# Patient Record
Sex: Male | Born: 1970 | Race: White | Hispanic: No | Smoking: Current every day smoker
Health system: Southern US, Community
[De-identification: ages and names within clinical notes are randomized; demographics above are authoritative.]

## PROBLEM LIST (undated history)

## (undated) DIAGNOSIS — IMO0002 Reserved for concepts with insufficient information to code with codable children: Secondary | ICD-10-CM

## (undated) DIAGNOSIS — E785 Hyperlipidemia, unspecified: Secondary | ICD-10-CM

## (undated) DIAGNOSIS — M549 Dorsalgia, unspecified: Secondary | ICD-10-CM

## (undated) DIAGNOSIS — F419 Anxiety disorder, unspecified: Secondary | ICD-10-CM

## (undated) DIAGNOSIS — G8929 Other chronic pain: Secondary | ICD-10-CM

## (undated) HISTORY — DX: Other chronic pain: G89.29

## (undated) HISTORY — DX: Hyperlipidemia, unspecified: E78.5

## (undated) HISTORY — DX: Reserved for concepts with insufficient information to code with codable children: IMO0002

## (undated) HISTORY — PX: NO PAST SURGERIES: SHX2092

## (undated) HISTORY — DX: Dorsalgia, unspecified: M54.9

---

## 2013-08-19 ENCOUNTER — Ambulatory Visit (INDEPENDENT_AMBULATORY_CARE_PROVIDER_SITE_OTHER): Payer: PRIVATE HEALTH INSURANCE | Admitting: Emergency Medicine

## 2013-08-19 VITALS — BP 114/60 | HR 78 | Temp 98.1°F | Resp 16 | Ht 67.25 in | Wt 183.2 lb

## 2013-08-19 DIAGNOSIS — G894 Chronic pain syndrome: Secondary | ICD-10-CM

## 2013-08-19 MED ORDER — HYDROCODONE-ACETAMINOPHEN 5-325 MG PO TABS: 1.0000 | ORAL_TABLET | Freq: Four times a day (QID) | ORAL | Status: DC | PRN

## 2013-08-19 NOTE — Patient Instructions (Signed)

## 2013-08-19 NOTE — Progress Notes (Signed)
[facility] [address] [place] [address] [phone]  Date:  08/19/2013   Name:  Leonard Martinez   DOB:  [birth year]   MRN:  [id number]  PCP:  No primary provider on file.    Chief Complaint: Medication Refill   History of Present Illness:  Leonard Martinez is a 42 y.o. very pleasant male patient who presents with the following:  Says he has been under treatment for chronic low back and neck pain with a physician in [place], [place] for 5 years.  Is out of his medication and unable to refill his vicodin due to a rescheduling of the medication.  Needs a refill on his narcotic.  Pain is not radiating and not associated with numbness or paresthesias.  No improvement with over the counter medications or other home remedies. Denies other complaint or health concern today.   There are no active problems to display for this patient.   Past Medical History  Diagnosis Date  . Hyperlipidemia   . Herniated disc   . Chronic back pain     History reviewed. No pertinent past surgical history.  History  Substance Use Topics  . Smoking status: Current Every Day Smoker -- 10.00 packs/day    Types: Cigarettes  . Smokeless tobacco: Not on file  . Alcohol Use: No    Family History  Problem Relation Age of Onset  . Bladder Cancer Mother   . Pancreatic cancer Father     No Known Allergies  Medication list has been reviewed and updated.  No current outpatient prescriptions on file prior to visit.   No current facility-administered medications on file prior to visit.    Review of Systems:  As per HPI, otherwise negative.    Physical Examination: Filed Vitals:   08/19/13 1735  BP: 114/60  Pulse: 78  Temp: 98.1 F (36.7 C)  Resp: 16   Filed Vitals:   08/19/13 1735  Height: 5' 7.25" (1.708 m)  Weight: 183 lb 3.2 oz (83.099 kg)   Body mass index is 28.49 kg/(m^2). Ideal Body Weight: Weight in (lb) to have BMI = 25: 160.5   GEN: WDWN, NAD,  Non-toxic, Alert & Oriented x 3 HEENT: Atraumatic, Normocephalic.  Ears and Nose: No external deformity. EXTR: No clubbing/cyanosis/edema NEURO: Normal gait.  PSYCH: Normally interactive. Conversant. Not depressed or anxious appearing.  Calm demeanor.    Assessment and Plan: I explained that he could not be treated for chronic pain in this office as it violates policy Further, I explained that he would be required to provide office records from his previous doctor for a referral to take place I offered a referral to pain management and one week only of medication with the understanding that no refill will be done.  Signed,  Phillips Odor, MD

## 2014-03-23 DIAGNOSIS — Y9289 Other specified places as the place of occurrence of the external cause: Secondary | ICD-10-CM | POA: Insufficient documentation

## 2014-03-23 DIAGNOSIS — Z79899 Other long term (current) drug therapy: Secondary | ICD-10-CM | POA: Insufficient documentation

## 2014-03-23 DIAGNOSIS — Z8739 Personal history of other diseases of the musculoskeletal system and connective tissue: Secondary | ICD-10-CM | POA: Insufficient documentation

## 2014-03-23 DIAGNOSIS — Y9389 Activity, other specified: Secondary | ICD-10-CM | POA: Insufficient documentation

## 2014-03-23 DIAGNOSIS — T25219A Burn of second degree of unspecified ankle, initial encounter: Secondary | ICD-10-CM | POA: Insufficient documentation

## 2014-03-23 DIAGNOSIS — X131XXA Other contact with steam and other hot vapors, initial encounter: Secondary | ICD-10-CM

## 2014-03-23 DIAGNOSIS — X12XXXA Contact with other hot fluids, initial encounter: Secondary | ICD-10-CM | POA: Insufficient documentation

## 2014-03-23 DIAGNOSIS — Y990 Civilian activity done for income or pay: Secondary | ICD-10-CM | POA: Insufficient documentation

## 2014-03-23 DIAGNOSIS — F17200 Nicotine dependence, unspecified, uncomplicated: Secondary | ICD-10-CM | POA: Insufficient documentation

## 2014-03-23 DIAGNOSIS — Z23 Encounter for immunization: Secondary | ICD-10-CM | POA: Insufficient documentation

## 2014-03-23 DIAGNOSIS — E785 Hyperlipidemia, unspecified: Secondary | ICD-10-CM | POA: Insufficient documentation

## 2014-03-23 DIAGNOSIS — G8929 Other chronic pain: Secondary | ICD-10-CM | POA: Insufficient documentation

## 2014-03-23 NOTE — ED Notes (Signed)
Pt reports spilling pot of hot water on foot

## 2014-03-24 ENCOUNTER — Encounter (HOSPITAL_BASED_OUTPATIENT_CLINIC_OR_DEPARTMENT_OTHER): Payer: Self-pay | Admitting: Emergency Medicine

## 2014-03-24 ENCOUNTER — Emergency Department (HOSPITAL_BASED_OUTPATIENT_CLINIC_OR_DEPARTMENT_OTHER)
Admission: EM | Admit: 2014-03-24 | Discharge: 2014-03-24 | Disposition: A | Discharge status: Home / Self Care | Payer: Worker's Compensation | Attending: Emergency Medicine | Admitting: Emergency Medicine

## 2014-03-24 DIAGNOSIS — T25022A Burn of unspecified degree of left foot, initial encounter: Secondary | ICD-10-CM

## 2014-03-24 MED ORDER — IBUPROFEN 600 MG PO TABS: 600.0000 mg | ORAL_TABLET | Freq: Four times a day (QID) | ORAL | Status: DC | PRN

## 2014-03-24 MED ORDER — IBUPROFEN 800 MG PO TABS
800.0000 mg | ORAL_TABLET | Freq: Once | ORAL | Status: AC
  Administered 2014-03-24: 800 mg via ORAL
  Filled 2014-03-24: qty 1

## 2014-03-24 MED ORDER — HYDROMORPHONE HCL PF 1 MG/ML IJ SOLN
1.0000 mg | Freq: Once | INTRAMUSCULAR | Status: AC
  Administered 2014-03-24: 1 mg via INTRAMUSCULAR
  Filled 2014-03-24: qty 1

## 2014-03-24 MED ORDER — OXYCODONE-ACETAMINOPHEN 5-325 MG PO TABS
2.0000 | ORAL_TABLET | Freq: Once | ORAL | Status: AC
  Administered 2014-03-24: 2 via ORAL
  Filled 2014-03-24: qty 2

## 2014-03-24 MED ORDER — TETANUS-DIPHTH-ACELL PERTUSSIS 5-2.5-18.5 LF-MCG/0.5 IM SUSP
0.5000 mL | Freq: Once | INTRAMUSCULAR | Status: AC
  Administered 2014-03-24: 0.5 mL via INTRAMUSCULAR
  Filled 2014-03-24: qty 0.5

## 2014-03-24 MED ORDER — BACITRACIN ZINC 500 UNIT/GM EX OINT
TOPICAL_OINTMENT | Freq: Two times a day (BID) | CUTANEOUS | Status: DC
  Administered 2014-03-24: 02:00:00 via TOPICAL

## 2014-03-24 MED ORDER — OXYCODONE-ACETAMINOPHEN 5-325 MG PO TABS: 1.0000 | ORAL_TABLET | ORAL | Status: DC | PRN

## 2014-03-24 NOTE — ED Notes (Signed)
I used telfa non-adherent bandage covered in bacitracin over wounds, then wrapped kerlix in thick application for padding and secured with tape and placed bariatric sized socks over, patient comfort improved. I also gave patient additional wound care supplies.

## 2014-03-24 NOTE — Discharge Instructions (Signed)

## 2014-03-24 NOTE — ED Provider Notes (Signed)
CSN: [id number]     Arrival date & time 03/23/14  2348 History  This chart was scribed for Leonard Martinez W Jquan Egelston, MD by Beverly MilchJ Harrison Martinez, ED Scribe. This patient was seen in room MH09/MH09 and the patient's care was started at 12:18 AM.    Chief Complaint  Patient presents with  . Foot Burn    Patient is a 43 y.o. male presenting with burn. The history is provided by the patient. No language interpreter was used.  Burn Burn location:  Foot Foot burn location:  L ankle, L foot and top of L foot Burn quality:  Intact blister and red Time since incident:  2 hours Progression:  Worsening Mechanism of burn:  Hot liquid Incident location:  Kitchen and work Relieved by:  Nothing Worsened by:  Movement and rubbing Ineffective treatments:  Salve Associated symptoms: no cough, no difficulty swallowing, no eye pain, no nasal burns and no shortness of breath   Tetanus status:  Unknown   HPI Comments: Leonard Martinez is a 43 y.o. male who presents to the Emergency Department complaining of a burn to the foot after spilling a pot of hot water on the foot at work. Pt denies any other health issues and states he is unsure of his last tetanus shot.    Past Medical History  Diagnosis Date  . Hyperlipidemia   . Herniated disc   . Chronic back pain     History reviewed. No pertinent past surgical history. Family History  Problem Relation Age of Onset  . Bladder Cancer Mother   . Pancreatic cancer Father     History  Substance Use Topics  . Smoking status: Current Every Day Smoker -- 10.00 packs/day    Types: Cigarettes  . Smokeless tobacco: Not on file  . Alcohol Use: No    Review of Systems  HENT: Negative for trouble swallowing.   Eyes: Negative for pain.  Respiratory: Negative for cough and shortness of breath.   Skin: Positive for wound.  All other systems reviewed and are negative.   Allergies  Review of patient's allergies indicates no known allergies.   Home Medications    Prior to Admission medications   Medication Sig Start Date End Date Taking? Authorizing Provider  atorvastatin (LIPITOR) 80 MG tablet Take 80 mg by mouth 2 (two) times daily.    Historical Provider, MD  cyclobenzaprine (FLEXERIL) 10 MG tablet Take 10 mg by mouth 2 (two) times daily as needed for muscle spasms.    Historical Provider, MD  HYDROcodone-acetaminophen (NORCO/VICODIN) 5-325 MG per tablet Take 1 tablet by mouth every 6 (six) hours as needed for pain. 08/19/13   Phillips OdorJeffery Anderson, MD    Triage Vitals: BP 128/86  Pulse 95  Temp(Src) 98.2 F (36.8 C) (Oral)  SpO2 98%   Physical Exam  CONSTITUTIONAL: Well developed/well nourished HEAD: Normocephalic/atraumatic EYES: EOMI/PERRL ENMT: Mucous membranes moist NECK: supple no meningeal signs CV: S1/S2 noted, no murmurs/rubs/gallops noted LUNGS: Lungs are clear to auscultation bilaterally, no apparent distress NEURO: Pt is awake/alert, moves all extremitiesx4 EXTREMITIES: pulses normal, full ROM, Erythema and blistering to medial aspect of left ankle SKIN: warm, color normal PSYCH: no abnormalities of mood noted  ED Course  Procedures    DIAGNOSTIC STUDIES: Oxygen Saturation is 98% on RA, normal by my interpretation.     COORDINATION OF CARE: 12:22 AM- Pt advised of plan for treatment and pt agrees.  Pt with isolated burn to left ankle.  Suspect 2nd degree burn.  Wound care provided He has no local PCP.  Advised to return to ER for wound evaluation in 48-72 hrs   MDM   Final diagnoses:  Burn of left foot    Nursing notes including past medical history and social history reviewed and considered in documentation   I personally performed the services described in this documentation, which was scribed in my presence. The recorded information has been reviewed and is accurate.      Leonard Martinez W Annisa Mazzarella, MD 03/24/14 [phone]

## 2014-03-27 ENCOUNTER — Emergency Department (HOSPITAL_BASED_OUTPATIENT_CLINIC_OR_DEPARTMENT_OTHER)
Admission: EM | Admit: 2014-03-27 | Discharge: 2014-03-27 | Disposition: A | Discharge status: Home / Self Care | Payer: Worker's Compensation | Attending: Emergency Medicine | Admitting: Emergency Medicine

## 2014-03-27 ENCOUNTER — Encounter (HOSPITAL_BASED_OUTPATIENT_CLINIC_OR_DEPARTMENT_OTHER): Payer: Self-pay | Admitting: Emergency Medicine

## 2014-03-27 DIAGNOSIS — T25019A Burn of unspecified degree of unspecified ankle, initial encounter: Secondary | ICD-10-CM

## 2014-03-27 DIAGNOSIS — E785 Hyperlipidemia, unspecified: Secondary | ICD-10-CM | POA: Insufficient documentation

## 2014-03-27 DIAGNOSIS — Y9389 Activity, other specified: Secondary | ICD-10-CM | POA: Insufficient documentation

## 2014-03-27 DIAGNOSIS — Z79899 Other long term (current) drug therapy: Secondary | ICD-10-CM | POA: Insufficient documentation

## 2014-03-27 DIAGNOSIS — X131XXA Other contact with steam and other hot vapors, initial encounter: Secondary | ICD-10-CM

## 2014-03-27 DIAGNOSIS — Z8739 Personal history of other diseases of the musculoskeletal system and connective tissue: Secondary | ICD-10-CM | POA: Insufficient documentation

## 2014-03-27 DIAGNOSIS — T25219A Burn of second degree of unspecified ankle, initial encounter: Secondary | ICD-10-CM | POA: Insufficient documentation

## 2014-03-27 DIAGNOSIS — G8929 Other chronic pain: Secondary | ICD-10-CM | POA: Insufficient documentation

## 2014-03-27 DIAGNOSIS — F17200 Nicotine dependence, unspecified, uncomplicated: Secondary | ICD-10-CM | POA: Insufficient documentation

## 2014-03-27 DIAGNOSIS — Y9289 Other specified places as the place of occurrence of the external cause: Secondary | ICD-10-CM | POA: Insufficient documentation

## 2014-03-27 DIAGNOSIS — Y990 Civilian activity done for income or pay: Secondary | ICD-10-CM | POA: Insufficient documentation

## 2014-03-27 DIAGNOSIS — X12XXXA Contact with other hot fluids, initial encounter: Secondary | ICD-10-CM | POA: Insufficient documentation

## 2014-03-27 MED ORDER — OXYCODONE-ACETAMINOPHEN 5-325 MG PO TABS: 2.0000 | ORAL_TABLET | ORAL | Status: DC | PRN

## 2014-03-27 MED ORDER — SILVER SULFADIAZINE 1 % EX CREA
TOPICAL_CREAM | Freq: Once | CUTANEOUS | Status: AC
  Administered 2014-03-27: 09:00:00 via TOPICAL
  Filled 2014-03-27: qty 85

## 2014-03-27 NOTE — Discharge Instructions (Signed)
Apply Silvadene dressings twice daily for the next several days.  Percocet as needed for pain.  Return to the ER if you develop redness, red streaks up the leg, pus draining from the wound, or any other new and concerning symptoms.   Burn Care Your skin is a natural barrier to infection. It is the largest organ of your body. Burns damage this natural protection. To help prevent infection, it is very important to follow your caregiver's instructions in the care of your burn. Burns are classified as:  First degree. There is only redness of the skin (erythema). No scarring is expected.  Second degree. There is blistering of the skin. Scarring may occur with deeper burns.  Third degree. All layers of the skin are injured, and scarring is expected. HOME CARE INSTRUCTIONS   Wash your hands well before changing your bandage.  Change your bandage as often as directed by your caregiver.  Remove the old bandage. If the bandage sticks, you may soak it off with cool, clean water.  Cleanse the burn thoroughly but gently with mild soap and water.  Pat the area dry with a clean, dry cloth.  Apply a thin layer of antibacterial cream to the burn.  Apply a clean bandage as instructed by your caregiver.  Keep the bandage as clean and dry as possible.  Elevate the affected area for the first 24 hours, then as instructed by your caregiver.  Only take over-the-counter or prescription medicines for pain, discomfort, or fever as directed by your caregiver. SEEK IMMEDIATE MEDICAL CARE IF:   You develop excessive pain.  You develop redness, tenderness, swelling, or red streaks near the burn.  The burned area develops yellowish-white fluid (pus) or a bad smell.  You have a fever. MAKE SURE YOU:   Understand these instructions.  Will watch your condition.  Will get help right away if you are not doing well or get worse. Document Released: 10/25/2005 Document Revised: 01/17/2012 Document  Reviewed: 03/17/2011 [facility] Patient Information 2014 [employer], [place].

## 2014-03-27 NOTE — ED Provider Notes (Signed)
CSN: [id number]     Arrival date & time 03/27/14  [id number] History   First MD Initiated Contact with Patient 03/27/14 [phone]     Chief Complaint  Patient presents with  . Follow-up     (Consider location/radiation/quality/duration/timing/severity/associated sxs/prior Treatment) HPI Comments: Patient is a 43 year old male presents for followup of a burn sustained a left ankle. He accidentally spilled boiling water into his shoe while cleaning up at work Saturday evening. He was seen here and had a dressing applied. He is told to come back here for followup as he does not have a primary care Dr. He states he continues with pain but denies any purulent drainage, fever.  The history is provided by the patient.    Past Medical History  Diagnosis Date  . Hyperlipidemia   . Herniated disc   . Chronic back pain    History reviewed. No pertinent past surgical history. Family History  Problem Relation Age of Onset  . Bladder Cancer Mother   . Pancreatic cancer Father    History  Substance Use Topics  . Smoking status: Current Every Day Smoker -- 10.00 packs/day    Types: Cigarettes  . Smokeless tobacco: Not on file  . Alcohol Use: No    Review of Systems  All other systems reviewed and are negative.     Allergies  Review of patient's allergies indicates no known allergies.  Home Medications   Prior to Admission medications   Medication Sig Start Date End Date Taking? Authorizing Provider  atorvastatin (LIPITOR) 80 MG tablet Take 80 mg by mouth 2 (two) times daily.    Historical Provider, MD  ibuprofen (ADVIL,MOTRIN) 600 MG tablet Take 1 tablet (600 mg total) by mouth every 6 (six) hours as needed. 03/24/14   Joya Gaskinsonald W Wickline, MD  oxyCODONE-acetaminophen (PERCOCET/ROXICET) 5-325 MG per tablet Take 1 tablet by mouth every 4 (four) hours as needed for severe pain. 03/24/14   Joya Gaskinsonald W Wickline, MD   BP 138/83  Pulse 67  Temp(Src) 98.3 F (36.8 C) (Oral)  Resp 20  Ht 5\' 9"  (1.753  m)  Wt 165 lb (74.844 kg)  BMI 24.36 kg/m2  SpO2 100% Physical Exam  Constitutional: He is oriented to person, place, and time. He appears well-developed and well-nourished. No distress.  HENT:  Head: Normocephalic and atraumatic.  Neck: Normal range of motion. Neck supple.  Neurological: He is alert and oriented to person, place, and time.  Skin: Skin is warm and dry. He is not diaphoretic.  The left ankle is noted to have second-degree burns to the inside of the left ankle and top of the left foot. There was extensive blistering which has since opened and drained.    ED Course  Procedures (including critical care time) Labs Review Labs Reviewed - No data to display  Imaging Review No results found.   EKG Interpretation None      MDM   Final diagnoses:  None    Will apply a Silvadene dressing and have him continue this at home twice daily. He is to return if he develops purulent drainage or increasing pain.    Geoffery Lyonsouglas Rylan Bernard, MD 03/27/14 [phone]

## 2014-03-27 NOTE — ED Notes (Signed)
Pt sts he was here Saturday after accidentally pouring boiling water on L foot and sts he was told to come back for a follow up in 48-72hrs since he does not have a PCP.

## 2015-06-25 ENCOUNTER — Encounter (HOSPITAL_COMMUNITY): Payer: Self-pay | Admitting: Emergency Medicine

## 2015-06-25 ENCOUNTER — Inpatient Hospital Stay (HOSPITAL_COMMUNITY)
Admission: EM | Admit: 2015-06-25 | Discharge: 2015-06-27 | DRG: 918 | Payer: No Typology Code available for payment source | Attending: Pulmonary Disease | Admitting: Pulmonary Disease

## 2015-06-25 DIAGNOSIS — F322 Major depressive disorder, single episode, severe without psychotic features: Secondary | ICD-10-CM | POA: Diagnosis not present

## 2015-06-25 DIAGNOSIS — T39091A Poisoning by salicylates, accidental (unintentional), initial encounter: Secondary | ICD-10-CM | POA: Diagnosis present

## 2015-06-25 DIAGNOSIS — E785 Hyperlipidemia, unspecified: Secondary | ICD-10-CM | POA: Diagnosis present

## 2015-06-25 DIAGNOSIS — F1994 Other psychoactive substance use, unspecified with psychoactive substance-induced mood disorder: Secondary | ICD-10-CM | POA: Diagnosis present

## 2015-06-25 DIAGNOSIS — T39012A Poisoning by aspirin, intentional self-harm, initial encounter: Principal | ICD-10-CM | POA: Diagnosis present

## 2015-06-25 DIAGNOSIS — T39092A Poisoning by salicylates, intentional self-harm, initial encounter: Secondary | ICD-10-CM | POA: Diagnosis not present

## 2015-06-25 DIAGNOSIS — G8929 Other chronic pain: Secondary | ICD-10-CM | POA: Diagnosis present

## 2015-06-25 DIAGNOSIS — F329 Major depressive disorder, single episode, unspecified: Secondary | ICD-10-CM | POA: Diagnosis present

## 2015-06-25 DIAGNOSIS — F1410 Cocaine abuse, uncomplicated: Secondary | ICD-10-CM | POA: Diagnosis not present

## 2015-06-25 DIAGNOSIS — F17210 Nicotine dependence, cigarettes, uncomplicated: Secondary | ICD-10-CM | POA: Diagnosis present

## 2015-06-25 DIAGNOSIS — F19959 Other psychoactive substance use, unspecified with psychoactive substance-induced psychotic disorder, unspecified: Secondary | ICD-10-CM

## 2015-06-25 DIAGNOSIS — T5192XA Toxic effect of unspecified alcohol, intentional self-harm, initial encounter: Secondary | ICD-10-CM | POA: Diagnosis present

## 2015-06-25 DIAGNOSIS — T1491 Suicide attempt: Secondary | ICD-10-CM | POA: Diagnosis not present

## 2015-06-25 DIAGNOSIS — Y92009 Unspecified place in unspecified non-institutional (private) residence as the place of occurrence of the external cause: Secondary | ICD-10-CM

## 2015-06-25 DIAGNOSIS — R45851 Suicidal ideations: Secondary | ICD-10-CM | POA: Diagnosis not present

## 2015-06-25 DIAGNOSIS — T1491XA Suicide attempt, initial encounter: Secondary | ICD-10-CM | POA: Diagnosis present

## 2015-06-25 DIAGNOSIS — M549 Dorsalgia, unspecified: Secondary | ICD-10-CM | POA: Diagnosis present

## 2015-06-25 LAB — COMPREHENSIVE METABOLIC PANEL
ALT: 14 U/L — ABNORMAL LOW (ref 17–63)
AST: 15 U/L (ref 15–41)
Albumin: 4.1 g/dL (ref 3.5–5.0)
Alkaline Phosphatase: 83 U/L (ref 38–126)
Anion gap: 10 (ref 5–15)
BUN: 7 mg/dL (ref 6–20)
CALCIUM: 9.1 mg/dL (ref 8.9–10.3)
CHLORIDE: 105 mmol/L (ref 101–111)
CO2: 25 mmol/L (ref 22–32)
Creatinine, Ser: 1.02 mg/dL (ref 0.61–1.24)
GFR calc non Af Amer: >60 mL/min (ref >60)
Glucose, Bld: 89 mg/dL (ref 65–99)
POTASSIUM: 3.8 mmol/L (ref 3.5–5.1)
Sodium: 140 mmol/L (ref 135–145)
Total Bilirubin: 1.1 mg/dL (ref 0.3–1.2)
Total Protein: 7 g/dL (ref 6.5–8.1)

## 2015-06-25 LAB — URINALYSIS, ROUTINE W REFLEX MICROSCOPIC
Bilirubin Urine: NEGATIVE
Bilirubin Urine: NEGATIVE
Glucose, UA: NEGATIVE mg/dL
Glucose, UA: NEGATIVE mg/dL
HGB URINE DIPSTICK: NEGATIVE
Hgb urine dipstick: NEGATIVE
Ketones, ur: NEGATIVE mg/dL
Ketones, ur: NEGATIVE mg/dL
LEUKOCYTES UA: NEGATIVE
Leukocytes, UA: NEGATIVE
NITRITE: NEGATIVE
Nitrite: NEGATIVE
PROTEIN: NEGATIVE mg/dL
Protein, ur: NEGATIVE mg/dL
SPECIFIC GRAVITY, URINE: 1.009 (ref 1.005–1.030)
Specific Gravity, Urine: 1.007 (ref 1.005–1.030)
UROBILINOGEN UA: 0.2 mg/dL (ref 0.0–1.0)
UROBILINOGEN UA: 0.2 mg/dL (ref 0.0–1.0)
pH: 6 (ref 5.0–8.0)
pH: 6 (ref 5.0–8.0)

## 2015-06-25 LAB — CBC
HCT: 41.5 % (ref 39.0–52.0)
HCT: 40.1 % (ref 39.0–52.0)
HEMOGLOBIN: 13.8 g/dL (ref 13.0–17.0)
HEMOGLOBIN: 13.6 g/dL (ref 13.0–17.0)
MCH: 30.5 pg (ref 26.0–34.0)
MCH: 31.4 pg (ref 26.0–34.0)
MCHC: 33.3 g/dL (ref 30.0–36.0)
MCHC: 33.9 g/dL (ref 30.0–36.0)
MCV: 91.8 fL (ref 78.0–100.0)
MCV: 92.6 fL (ref 78.0–100.0)
Platelets: 242 10*3/uL (ref 150–400)
Platelets: 227 10*3/uL (ref 150–400)
RBC: 4.52 MIL/uL (ref 4.22–5.81)
RBC: 4.33 MIL/uL (ref 4.22–5.81)
RDW: 13.2 % (ref 11.5–15.5)
RDW: 13.3 % (ref 11.5–15.5)
WBC: 9.2 10*3/uL (ref 4.0–10.5)
WBC: 9.1 10*3/uL (ref 4.0–10.5)

## 2015-06-25 LAB — BLOOD GAS, ARTERIAL
ACID-BASE EXCESS: 0.8 mmol/L (ref 0.0–2.0)
BICARBONATE: 23.9 meq/L (ref 20.0–24.0)
Drawn by: 252031
O2 Saturation: 97.3 %
PATIENT TEMPERATURE: 98.6
PH ART: 7.488 — AB (ref 7.350–7.450)
TCO2: 24.8 mmol/L (ref 0–100)
pCO2 arterial: 31.8 mmHg — ABNORMAL LOW (ref 35.0–45.0)
pO2, Arterial: 90.7 mmHg (ref 80.0–100.0)

## 2015-06-25 LAB — RAPID URINE DRUG SCREEN, HOSP PERFORMED
Amphetamines: NOT DETECTED
BENZODIAZEPINES: NOT DETECTED
Barbiturates: NOT DETECTED
COCAINE: POSITIVE — AB
OPIATES: NOT DETECTED
TETRAHYDROCANNABINOL: POSITIVE — AB

## 2015-06-25 LAB — GLUCOSE, CAPILLARY: Glucose-Capillary: 85 mg/dL (ref 65–99)

## 2015-06-25 LAB — BASIC METABOLIC PANEL
Anion gap: 11 (ref 5–15)
BUN: 6 mg/dL (ref 6–20)
CHLORIDE: 107 mmol/L (ref 101–111)
CO2: 26 mmol/L (ref 22–32)
CREATININE: 1.28 mg/dL — AB (ref 0.61–1.24)
Calcium: 8.7 mg/dL — ABNORMAL LOW (ref 8.9–10.3)
GFR calc Af Amer: >60 mL/min (ref >60)
GFR calc non Af Amer: >60 mL/min (ref >60)
GLUCOSE: 108 mg/dL — AB (ref 65–99)
Potassium: 3.6 mmol/L (ref 3.5–5.1)
Sodium: 144 mmol/L (ref 135–145)

## 2015-06-25 LAB — ACETAMINOPHEN LEVEL

## 2015-06-25 LAB — ETHANOL: Alcohol, Ethyl (B): <5 mg/dL (ref <5)

## 2015-06-25 LAB — SALICYLATE LEVEL
SALICYLATE LVL: 16.1 mg/dL (ref 2.8–30.0)
Salicylate Lvl: 36.4 mg/dL (ref 2.8–30.0)

## 2015-06-25 LAB — LACTIC ACID, PLASMA: Lactic Acid, Venous: 1.5 mmol/L (ref 0.5–2.0)

## 2015-06-25 LAB — CBG MONITORING, ED: Glucose-Capillary: 103 mg/dL — ABNORMAL HIGH (ref 65–99)

## 2015-06-25 LAB — MAGNESIUM: Magnesium: 2.1 mg/dL (ref 1.7–2.4)

## 2015-06-25 LAB — MRSA PCR SCREENING: MRSA by PCR: NEGATIVE

## 2015-06-25 LAB — PHOSPHORUS: PHOSPHORUS: 3.6 mg/dL (ref 2.5–4.6)

## 2015-06-25 MED ORDER — SODIUM CHLORIDE 0.9 % IV SOLN
INTRAVENOUS | Status: DC
  Administered 2015-06-25 – 2015-06-27 (×3): via INTRAVENOUS

## 2015-06-25 MED ORDER — SODIUM CHLORIDE 0.9 % IV SOLN: 250.0000 mL | INTRAVENOUS | Status: DC | PRN

## 2015-06-25 MED ORDER — POTASSIUM CHLORIDE CRYS ER 20 MEQ PO TBCR
40.0000 meq | EXTENDED_RELEASE_TABLET | ORAL | Status: AC
  Administered 2015-06-25 – 2015-06-26 (×2): 40 meq via ORAL
  Filled 2015-06-25 (×2): qty 2

## 2015-06-25 MED ORDER — SODIUM CHLORIDE 0.9 % IV BOLUS (SEPSIS)
1000.0000 mL | Freq: Once | INTRAVENOUS | Status: AC
  Administered 2015-06-25: 1000 mL via INTRAVENOUS

## 2015-06-25 MED ORDER — CHARCOAL ACTIVATED PO LIQD
50.0000 g | Freq: Once | ORAL | Status: AC
  Administered 2015-06-25: 50 g via ORAL
  Filled 2015-06-25: qty 240

## 2015-06-25 MED ORDER — ACTIDOSE WITH SORBITOL 50 GM/240ML PO LIQD
50.0000 g | Freq: Once | ORAL | Status: DC
Start: 2015-06-25 — End: 2015-06-25
  Filled 2015-06-25: qty 240

## 2015-06-25 MED ORDER — MAGNESIUM OXIDE 400 (241.3 MG) MG PO TABS
400.0000 mg | ORAL_TABLET | ORAL | Status: AC
  Administered 2015-06-25 – 2015-06-26 (×2): 400 mg via ORAL
  Filled 2015-06-25 (×2): qty 1

## 2015-06-25 MED ORDER — SODIUM BICARBONATE 8.4 % IV SOLN
INTRAVENOUS | Status: DC
  Administered 2015-06-25: 22:00:00 via INTRAVENOUS
  Filled 2015-06-25: qty 850

## 2015-06-25 MED ORDER — DEXTROSE 50 % IV SOLN
1.0000 | INTRAVENOUS | Status: DC | PRN
  Administered 2015-06-25 – 2015-06-26 (×2): 50 mL via INTRAVENOUS
  Filled 2015-06-25 (×3): qty 50

## 2015-06-25 NOTE — ED Provider Notes (Signed)
CSN: [id number]     Arrival date & time 06/25/15  1346 History   First MD Initiated Contact with Patient 06/25/15 1402     Chief Complaint  Patient presents with  . Ingestion  . Suicidal     (Consider location/radiation/quality/duration/timing/severity/associated sxs/prior Treatment) Patient is a 44 y.o. male presenting with Ingested Medication and mental health disorder.  Ingestion This is a new problem. The current episode started 1 to 2 hours ago. Episode frequency: once. The problem has not changed since onset.Pertinent negatives include no chest pain, no abdominal pain, no headaches and no shortness of breath.  Mental Health Problem Presenting symptoms: suicide attempt   Patient accompanied by:  Law enforcement Degree of incapacity (severity):  Severe Onset quality:  Sudden Duration:  1 hour Timing:  Constant Progression:  Worsening Context: stressful life event   Relieved by:  Nothing Worsened by:  Nothing tried Associated symptoms: feelings of worthlessness   Associated symptoms: no abdominal pain, no chest pain and no headaches     Past Medical History  Diagnosis Date  . Hyperlipidemia   . Herniated disc   . Chronic back pain    History reviewed. No pertinent past surgical history. Family History  Problem Relation Age of Onset  . Bladder Cancer Mother   . Pancreatic cancer Father    Social History  Substance Use Topics  . Smoking status: Current Every Day Smoker -- 10.00 packs/day    Types: Cigarettes  . Smokeless tobacco: None  . Alcohol Use: Yes    Review of Systems  Respiratory: Negative for shortness of breath.   Cardiovascular: Negative for chest pain.  Gastrointestinal: Negative for abdominal pain.  Neurological: Negative for headaches.  All other systems reviewed and are negative.     Allergies  Review of patient's allergies indicates no known allergies.  Home Medications   Prior to Admission medications   Not on File   BP 148/74 mmHg   Pulse 91  Temp(Src) 98.3 F (36.8 C) (Oral)  Resp 17  SpO2 97% Physical Exam  Constitutional: He is oriented to person, place, and time. He appears well-developed and well-nourished. No distress.  HENT:  Head: Normocephalic and atraumatic.  Mouth/Throat: Oropharynx is clear and moist.  Eyes: Conjunctivae are normal. Pupils are equal, round, and reactive to light. No scleral icterus.  Neck: Neck supple.  Cardiovascular: Normal rate, regular rhythm, normal heart sounds and intact distal pulses.   No murmur heard. Pulmonary/Chest: Effort normal and breath sounds normal. No stridor. No respiratory distress. He has no wheezes. He has no rales.  Abdominal: Soft. He exhibits no distension. There is no tenderness.  Musculoskeletal: Normal range of motion. He exhibits no edema.  Neurological: He is alert and oriented to person, place, and time.  Skin: Skin is warm and dry. No rash noted.  Psychiatric: He is withdrawn. He exhibits a depressed mood. He expresses suicidal ideation.  Nursing note and vitals reviewed.   ED Course  CRITICAL CARE Performed by: Blake Divine Authorized by: Blake Divine Total critical care time: 40 minutes Critical care time was exclusive of separately billable procedures and treating other patients. Critical care was necessary to treat or prevent imminent or life-threatening deterioration of the following conditions: toxidrome. Critical care was time spent personally by me on the following activities: development of treatment plan with patient or surrogate, discussions with consultants, evaluation of patient's response to treatment, examination of patient, obtaining history from patient or surrogate, ordering and performing treatments and interventions, ordering and  review of laboratory studies, ordering and review of radiographic studies, pulse oximetry, re-evaluation of patient's condition and review of old charts.   (including critical care time) Labs  Review Labs Reviewed  COMPREHENSIVE METABOLIC PANEL - Abnormal; Notable for the following:    ALT 14 (*)    All other components within normal limits  ACETAMINOPHEN LEVEL - Abnormal; Notable for the following:    Acetaminophen (Tylenol), Serum <10 (*)    All other components within normal limits  CBG MONITORING, ED - Abnormal; Notable for the following:    Glucose-Capillary 103 (*)    All other components within normal limits  ETHANOL  SALICYLATE LEVEL  CBC  URINE RAPID DRUG SCREEN, HOSP PERFORMED  URINALYSIS, ROUTINE W REFLEX MICROSCOPIC (NOT AT [facility])      EKG Interpretation   Date/Time:  Wednesday June 25 2015 14:06:48 EDT Ventricular Rate:  85 PR Interval:  175 QRS Duration: 82 QT Interval:  374 QTC Calculation: 445 R Axis:   71 Text Interpretation:  Sinus rhythm Left atrial enlargement Nonspecific T  abnrm, anterolateral leads ST elev, probable normal early repol pattern No  old tracing to compare Confirmed by [facility]  MD, TREY ([id number]) on 06/25/2015  5:26:16 PM      MDM   Final diagnoses:  Salicylate overdose, intentional self-harm, initial encounter    44 yo male with suicide attempt by overdosing on seventy-four  aspirin pills.  Reported ingestion time was approximately 1:30pm.  He left a suicide note to his wife.  IVC paperwork drawn by me.    Initial labwork showed a detectable aspirin level, but not yet toxic.  However, he is at high risk for significant clinical deterioration.  I spoke with Dr. Allena Katz (Nephrology) to make him aware of patient.  Also spoke with Dr. Vaughan Basta about ICU admission.  [facility] if [facility].    At poison control recommendation, he was given activated charcoal.  Also treated with multiple NS boluses.      Blake Divine, MD 06/25/15 [phone]

## 2015-06-25 NOTE — H&P (Addendum)
Name: Kayhan Boardley MRN: [id number] DOB: [birth year]    ADMISSION DATE:  06/25/2015  REFERRING MD :  EDP  CHIEF COMPLAINT:  Salicylate toxicity   BRIEF PATIENT DESCRIPTION: 44 yo male with hx chronic pain presented 8/17 after intentional ASA overdose.  Pt admitted to taking ~74  ASA and beer stating that he did not want to live anymore.  Suicide note found by wife (separated). PCCM consulted to admit for close monitoring and ?HD.   SIGNIFICANT EVENTS:  STUDIES:   HISTORY OF PRESENT ILLNESS:   44 yo male with PMH as below, which includes chronic pain,  presented 8/17 after intentional ASA overdose.  Pt admitted to taking ~74  ASA and beer stating that he did not want to live anymore.  Suicide note found by wife (separated). Initial ASA level in ED was elevated, but not toxic. However, this is expected to get worse. PCCM consulted to admit for close monitoring and possible dialysis. ED contacted nephrology as well and made them aware of the patient. In ED he denied chest pain, SOB, headache, dizziness, abd pain, n/v.  He has reportedly been depressed and wanted to end is life r/t recent separation from wife.  Pts wife was just awarded full custody of their children 8/16 and he was granted only supervised visits.   PAST MEDICAL HISTORY :   has a past medical history of Hyperlipidemia; Herniated disc; and Chronic back pain.  has no past surgical history on file. Prior to Admission medications   Not on File   No Known Allergies  FAMILY HISTORY:  family history includes Bladder Cancer in his mother; Pancreatic cancer in his father. SOCIAL HISTORY:  reports that he has been smoking Cigarettes.  He has been smoking about 10.00 packs per day. He does not have any smokeless tobacco history on file. He reports that he drinks alcohol. He reports that he does not use illicit drugs.  REVIEW OF SYSTEMS:  - Unwilling to cooperate  SUBJECTIVE:   VITAL SIGNS: Temp:  [98.3 F (36.8 C)]  98.3 F (36.8 C) (08/17 1352) Pulse Rate:  [72-91] 77 (08/17 1557) Resp:  [17-22] 18 (08/17 1557) BP: (120-148)/(74-91) 131/86 mmHg (08/17 1557) SpO2:  [94 %-99 %] 95 % (08/17 1557)  PHYSICAL EXAMINATION: General:  Male of normal body habitus in NAD Neuro:  Alert, oriented, obviously very depressed HEENT:  [place]/AT, no JVD noted, PERRL Cardiovascular:  RRR, no MRG Lungs:  Clear bilateral breath sounds Abdomen:  Soft, non-tender, non-distended Musculoskeletal:  No acute deformity or ROM limitations Skin:  Grossly intact   Recent Labs Lab 06/25/15 1408  NA 140  K 3.8  CL 105  CO2 25  BUN 7  CREATININE 1.02  GLUCOSE 89    Recent Labs Lab 06/25/15 1408  HGB 13.8  HCT 41.5  WBC 9.2  PLT 242   No results found.  ASSESSMENT / PLAN:  Salicylate Overdose, intentional  Suicide Attempt  Polysubstance abuse (cocaine, THC)  PLAN  - Admit to [facility] ICU - Serial salicylate level - Serial chemistries - Check ABG, lactic  - Serial UA to assess urine PH - May need alkalinization with sodium bicarb in d5w to target urine pH 7.5 -8 - Indications for dialysis would include AMS, seizures, ASA level > 90, renal failure with SCr > 2, pulmonary edema, or pH < 7.2.  - Nephrology aware  - CBG monitoring. Keep glucose > 100. Also give glucose for any alteration in mental status - Treat hypokalemia  aggressively, keep K above 4.2 - Keep Mag > 1.2  - Suicide precautions - Will need psych eval  Joneen Roach, AGACNP-BC [facility] Pager [phone] or [phone]  06/25/2015 7:50 PM   Attending Note:  I have examined patient, reviewed labs, studies and notes. I have discussed the case with Henreitta Leber, and I agree with the data and plans as amended above. Pt with depression exacerbated by marital strife, presented to [facility] ED after an intentional ingestion of ASA, estimated 24g. Initial ASA level 16, urine pH 6.0.  On eval he is profoundly depressed, admits  that he wanted to die and states that he still wants to die. He will clearly need psych consult in am. For now will plan to start bicarb for urine pH goal > 7.5, follow BMP. I will give K and Mg. Will notify renal if ASA level is rising.  Independent critical care time is 50 minutes.   Levy Pupa, MD, PhD 06/25/2015, 8:34 PM [facility] [phone] or if no answer [phone]

## 2015-06-25 NOTE — ED Notes (Signed)
Copy of suicide note that was left by pt in his vehicle placed in pt's chart at nurse's station.

## 2015-06-25 NOTE — ED Notes (Signed)
Per [employer] pt left note in the car addressed to his wife who pt is currently separated from.  Pt's wife was awarded custody of their children yesterday and he was granted supervised visits with the children.   Pt was in wife's employer's parking lot when one of her co-worker's saw him in the parking lot and asked her if that was him.  She text pt and that is when he told her he was ending his life.  She then called EMS.

## 2015-06-25 NOTE — ED Notes (Signed)
Per EMS pt comes from in his car where pt has ingested around 74 aspirin  with some beer.  Pt stated to EMS that he didn't want to live anymore.   BP 124palpated, 100HR, 137/87 last BP with EMS, CBG 106.

## 2015-06-25 NOTE — Progress Notes (Addendum)
eLink Physician-Brief Progress Note Patient Name: Leonard Martinez DOB: 18-Oct-1971 MRN: [id number]   Date of Service  06/25/2015  HPI/Events of Note  Salicylate Overdose. Initial level about 40 minutes s/p ingestion = 16.1.  eICU Interventions  Will order: 1. Salicylate level now.  2. Activated Charcoal 50 gm with sorbitol now.  3. Suicide precautions.      Intervention Category Intermediate Interventions: Other:  Lenell Antu 06/25/2015, 7:06 PM

## 2015-06-25 NOTE — ED Notes (Signed)
Bed: RESB Expected date:  Expected time:  Means of arrival:  Comments: EMS- 44 yo M, Asprin OD

## 2015-06-25 NOTE — ED Notes (Addendum)
Poison control called. Recommendations given by Elnita Maxwell, will fax paper copy of recommendations to ED.

## 2015-06-26 DIAGNOSIS — F1210 Cannabis abuse, uncomplicated: Secondary | ICD-10-CM

## 2015-06-26 DIAGNOSIS — F1410 Cocaine abuse, uncomplicated: Secondary | ICD-10-CM

## 2015-06-26 DIAGNOSIS — R45851 Suicidal ideations: Secondary | ICD-10-CM

## 2015-06-26 DIAGNOSIS — T39012A Poisoning by aspirin, intentional self-harm, initial encounter: Principal | ICD-10-CM

## 2015-06-26 DIAGNOSIS — F19959 Other psychoactive substance use, unspecified with psychoactive substance-induced psychotic disorder, unspecified: Secondary | ICD-10-CM

## 2015-06-26 DIAGNOSIS — T1491 Suicide attempt: Secondary | ICD-10-CM

## 2015-06-26 LAB — URINALYSIS, ROUTINE W REFLEX MICROSCOPIC
BILIRUBIN URINE: NEGATIVE
Glucose, UA: NEGATIVE mg/dL
Hgb urine dipstick: NEGATIVE
KETONES UR: NEGATIVE mg/dL
Leukocytes, UA: NEGATIVE
NITRITE: NEGATIVE
Protein, ur: NEGATIVE mg/dL
Specific Gravity, Urine: 1.009 (ref 1.005–1.030)
UROBILINOGEN UA: 0.2 mg/dL (ref 0.0–1.0)
pH: 7 (ref 5.0–8.0)

## 2015-06-26 LAB — BASIC METABOLIC PANEL
Anion gap: 10 (ref 5–15)
Anion gap: 9 (ref 5–15)
BUN: 6 mg/dL (ref 6–20)
BUN: 6 mg/dL (ref 6–20)
CHLORIDE: 109 mmol/L (ref 101–111)
CHLORIDE: 111 mmol/L (ref 101–111)
CO2: 24 mmol/L (ref 22–32)
CO2: 26 mmol/L (ref 22–32)
CREATININE: 1.23 mg/dL (ref 0.61–1.24)
CREATININE: 1.3 mg/dL — AB (ref 0.61–1.24)
Calcium: 8.5 mg/dL — ABNORMAL LOW (ref 8.9–10.3)
Calcium: 8.9 mg/dL (ref 8.9–10.3)
GFR calc non Af Amer: >60 mL/min (ref >60)
GFR calc non Af Amer: >60 mL/min (ref >60)
Glucose, Bld: 106 mg/dL — ABNORMAL HIGH (ref 65–99)
Glucose, Bld: 92 mg/dL (ref 65–99)
Potassium: 3.7 mmol/L (ref 3.5–5.1)
Potassium: 4.1 mmol/L (ref 3.5–5.1)
Sodium: 143 mmol/L (ref 135–145)
Sodium: 146 mmol/L — ABNORMAL HIGH (ref 135–145)

## 2015-06-26 LAB — SALICYLATE LEVEL
SALICYLATE LVL: 34 mg/dL — AB (ref 2.8–30.0)
Salicylate Lvl: 27.5 mg/dL (ref 2.8–30.0)

## 2015-06-26 LAB — GLUCOSE, CAPILLARY
GLUCOSE-CAPILLARY: 94 mg/dL (ref 65–99)
GLUCOSE-CAPILLARY: 95 mg/dL (ref 65–99)
Glucose-Capillary: 102 mg/dL — ABNORMAL HIGH (ref 65–99)
Glucose-Capillary: 137 mg/dL — ABNORMAL HIGH (ref 65–99)
Glucose-Capillary: 78 mg/dL (ref 65–99)
Glucose-Capillary: 107 mg/dL — ABNORMAL HIGH (ref 65–99)

## 2015-06-26 MED ORDER — HEPARIN SODIUM (PORCINE) 5000 UNIT/ML IJ SOLN
5000.0000 [IU] | Freq: Three times a day (TID) | INTRAMUSCULAR | Status: DC
  Administered 2015-06-26 – 2015-06-27 (×3): 5000 [IU] via SUBCUTANEOUS
  Filled 2015-06-26 (×4): qty 1

## 2015-06-26 MED ORDER — SODIUM BICARBONATE 8.4 % IV SOLN
INTRAVENOUS | Status: DC
  Administered 2015-06-26 – 2015-06-27 (×3): via INTRAVENOUS
  Filled 2015-06-26 (×5): qty 150

## 2015-06-26 MED ORDER — ACETAMINOPHEN 325 MG PO TABS
650.0000 mg | ORAL_TABLET | Freq: Four times a day (QID) | ORAL | Status: DC | PRN
  Administered 2015-06-27: 650 mg via ORAL
  Filled 2015-06-26: qty 2

## 2015-06-26 MED ORDER — FENTANYL CITRATE (PF) 100 MCG/2ML IJ SOLN: 50.0000 ug | INTRAMUSCULAR | Status: DC | PRN

## 2015-06-26 NOTE — Consult Note (Signed)
[facility] Consult   Reason for Consult:  Cocaine and THC abuse and intentional ASA overdose Referring Physician:  DR. Nelda Marseille Patient Identification: Leonard Martinez MRN:  [id number] Principal Diagnosis: Psychoactive substance-induced mood disorder Diagnosis:   Patient Active Problem List   Diagnosis Date Noted  . Salicylate overdose [G86.761P] 06/25/2015    Total Time spent with patient: 45 minutes  Subjective:   Leonard Martinez is a 44 y.o. male patient admitted with intentional overdose.  HPI:  Leonard Martinez is a 44 yo male admitted to [facility] with intentional overdose of ASA and intoxicated with cocaine and cannabis. He presented 8/17 after intentional ASA overdose. Pt admitted to taking ~74 339m ASA and beer stating that he did not want to live anymore. Suicide note found by wife (separated). Patient endorses depression and intentional oversoe of medication to end his life due to separation from his wife and children was taken away from him. Pts wife was just awarded full custody of their children 8/16 and he was granted only supervised visits. He is also irritable, angry and hungry, does not want to cooperate at this time but asking for food tray. Reportedly he was place on NPO due to altered mental state and now he is able stay awake, alert and able to ea, so will be ordered food tray as per the primary MD. He can not contract for safety. BAl is not significant and UDS is positive for cocaine and cannabis. Salicylate levels increased high at 36.4 mg/dL  HPI Elements:   Location:  depression . Quality:  poor. Severity:  suicide attempt. Timing:  separation from wife and children. Duration:  few days. Context:  psychosocial stresses.  Past Medical History:  Past Medical History  Diagnosis Date  . Hyperlipidemia   . Herniated disc   . Chronic back pain    History reviewed. No pertinent past surgical history. Family History:  Family History   Problem Relation Age of Onset  . Bladder Cancer Mother   . Pancreatic cancer Father    Social History:  History  Alcohol Use  . Yes     History  Drug Use No    Social History   Social History  . Marital Status: Married    Spouse Name: N/A  . Number of Children: N/A  . Years of Education: N/A   Social History Main Topics  . Smoking status: Current Every Day Smoker -- 10.00 packs/day    Types: Cigarettes  . Smokeless tobacco: None  . Alcohol Use: Yes  . Drug Use: No  . Sexual Activity: Yes   Other Topics Concern  . None   Social History Narrative   Additional Social History:                          Allergies:  No Known Allergies  Labs:  Results for orders placed or performed during the hospital encounter of 06/25/15 (from the past 48 hour(s))  Comprehensive metabolic panel     Status: Abnormal   Collection Time: 06/25/15  2:08 PM  Result Value Ref Range   Sodium 140 135 - 145 mmol/L   Potassium 3.8 3.5 - 5.1 mmol/L   Chloride 105 101 - 111 mmol/L   CO2 25 22 - 32 mmol/L   Glucose, Bld 89 65 - 99 mg/dL   BUN 7 6 - 20 mg/dL   Creatinine, Ser 1.02 0.61 - 1.24 mg/dL   Calcium 9.1 8.9 - 10.3  mg/dL   Total Protein 7.0 6.5 - 8.1 g/dL   Albumin 4.1 3.5 - 5.0 g/dL   AST 15 15 - 41 U/L   ALT 14 (L) 17 - 63 U/L   Alkaline Phosphatase 83 38 - 126 U/L   Total Bilirubin 1.1 0.3 - 1.2 mg/dL   GFR calc non Af Amer >60 >60 mL/min   GFR calc Af Amer >60 >60 mL/min    Comment: (NOTE) The eGFR has been calculated using the CKD EPI equation. This calculation has not been validated in all clinical situations. eGFR's persistently <60 mL/min signify possible Chronic Kidney Disease.    Anion gap 10 5 - 15  Ethanol (ETOH)     Status: None   Collection Time: 06/25/15  2:08 PM  Result Value Ref Range   Alcohol, Ethyl (B) <5 <5 mg/dL    Comment:        LOWEST DETECTABLE LIMIT FOR SERUM ALCOHOL IS 5 mg/dL FOR MEDICAL PURPOSES ONLY   Salicylate level     Status:  None   Collection Time: 06/25/15  2:08 PM  Result Value Ref Range   Salicylate Lvl 30.8 2.8 - 30.0 mg/dL  Acetaminophen level     Status: Abnormal   Collection Time: 06/25/15  2:08 PM  Result Value Ref Range   Acetaminophen (Tylenol), Serum <10 (L) 10 - 30 ug/mL    Comment:        THERAPEUTIC CONCENTRATIONS VARY SIGNIFICANTLY. A RANGE OF 10-30 ug/mL MAY BE AN EFFECTIVE CONCENTRATION FOR MANY PATIENTS. HOWEVER, SOME ARE BEST TREATED AT CONCENTRATIONS OUTSIDE THIS RANGE. ACETAMINOPHEN CONCENTRATIONS >150 ug/mL AT 4 HOURS AFTER INGESTION AND >50 ug/mL AT 12 HOURS AFTER INGESTION ARE OFTEN ASSOCIATED WITH TOXIC REACTIONS.   CBC     Status: None   Collection Time: 06/25/15  2:08 PM  Result Value Ref Range   WBC 9.2 4.0 - 10.5 K/uL   RBC 4.52 4.22 - 5.81 MIL/uL   Hemoglobin 13.8 13.0 - 17.0 g/dL   HCT 41.5 39.0 - 52.0 %   MCV 91.8 78.0 - 100.0 fL   MCH 30.5 26.0 - 34.0 pg   MCHC 33.3 30.0 - 36.0 g/dL   RDW 13.2 11.5 - 15.5 %   Platelets 242 150 - 400 K/uL  CBG monitoring, ED     Status: Abnormal   Collection Time: 06/25/15  4:50 PM  Result Value Ref Range   Glucose-Capillary 103 (H) 65 - 99 mg/dL  Urine rapid drug screen (hosp performed) (Not at [facility])     Status: Abnormal   Collection Time: 06/25/15  6:34 PM  Result Value Ref Range   Opiates NONE DETECTED NONE DETECTED   Cocaine POSITIVE (A) NONE DETECTED   Benzodiazepines NONE DETECTED NONE DETECTED   Amphetamines NONE DETECTED NONE DETECTED   Tetrahydrocannabinol POSITIVE (A) NONE DETECTED   Barbiturates NONE DETECTED NONE DETECTED    Comment:        DRUG SCREEN FOR MEDICAL PURPOSES ONLY.  IF CONFIRMATION IS NEEDED FOR ANY PURPOSE, NOTIFY LAB WITHIN 5 DAYS.        LOWEST DETECTABLE LIMITS FOR URINE DRUG SCREEN Drug Class       Cutoff (ng/mL) Amphetamine      1000 Barbiturate      200 Benzodiazepine   657 Tricyclics       846 Opiates          300 Cocaine          300 THC  50   Urinalysis,  Routine w reflex microscopic (not at [facility])     Status: None   Collection Time: 06/25/15  6:34 PM  Result Value Ref Range   Color, Urine YELLOW YELLOW   APPearance CLEAR CLEAR   Specific Gravity, Urine 1.009 1.005 - 1.030   pH 6.0 5.0 - 8.0   Glucose, UA NEGATIVE NEGATIVE mg/dL   Hgb urine dipstick NEGATIVE NEGATIVE   Bilirubin Urine NEGATIVE NEGATIVE   Ketones, ur NEGATIVE NEGATIVE mg/dL   Protein, ur NEGATIVE NEGATIVE mg/dL   Urobilinogen, UA 0.2 0.0 - 1.0 mg/dL   Nitrite NEGATIVE NEGATIVE   Leukocytes, UA NEGATIVE NEGATIVE    Comment: MICROSCOPIC NOT DONE ON URINES WITH NEGATIVE PROTEIN, BLOOD, LEUKOCYTES, NITRITE, OR GLUCOSE <1000 mg/dL.  MRSA PCR Screening     Status: None   Collection Time: 06/25/15  6:55 PM  Result Value Ref Range   MRSA by PCR NEGATIVE NEGATIVE    Comment:        The GeneXpert MRSA Assay (FDA approved for NASAL specimens only), is one component of a comprehensive MRSA colonization surveillance program. It is not intended to diagnose MRSA infection nor to guide or monitor treatment for MRSA infections.   Glucose, capillary     Status: None   Collection Time: 06/25/15  6:57 PM  Result Value Ref Range   Glucose-Capillary 85 65 - 99 mg/dL  Blood gas, arterial     Status: Abnormal   Collection Time: 06/25/15  7:55 PM  Result Value Ref Range   pH, Arterial 7.488 (H) 7.350 - 7.450   pCO2 arterial 31.8 (L) 35.0 - 45.0 mmHg   pO2, Arterial 90.7 80.0 - 100.0 mmHg   Bicarbonate 23.9 20.0 - 24.0 mEq/L   TCO2 24.8 0 - 100 mmol/L   Acid-Base Excess 0.8 0.0 - 2.0 mmol/L   O2 Saturation 97.3 %   Patient temperature 98.6    Collection site RIGHT RADIAL    Drawn by [id number]    Sample type ARTERIAL DRAW    Allens test (pass/fail) PASS PASS  Salicylate level     Status: Abnormal   Collection Time: 06/25/15  8:02 PM  Result Value Ref Range   Salicylate Lvl 50.9 (HH) 2.8 - 30.0 mg/dL    Comment: CRITICAL RESULT CALLED TO, READ BACK BY AND VERIFIED WITH: C  SMITH,RN 2047 06/25/15 D BRADLEY   Basic metabolic panel     Status: Abnormal   Collection Time: 06/25/15  8:02 PM  Result Value Ref Range   Sodium 144 135 - 145 mmol/L   Potassium 3.6 3.5 - 5.1 mmol/L   Chloride 107 101 - 111 mmol/L   CO2 26 22 - 32 mmol/L   Glucose, Bld 108 (H) 65 - 99 mg/dL   BUN 6 6 - 20 mg/dL   Creatinine, Ser 1.28 (H) 0.61 - 1.24 mg/dL   Calcium 8.7 (L) 8.9 - 10.3 mg/dL   GFR calc non Af Amer >60 >60 mL/min   GFR calc Af Amer >60 >60 mL/min    Comment: (NOTE) The eGFR has been calculated using the CKD EPI equation. This calculation has not been validated in all clinical situations. eGFR's persistently <60 mL/min signify possible Chronic Kidney Disease.    Anion gap 11 5 - 15  Magnesium     Status: None   Collection Time: 06/25/15  8:02 PM  Result Value Ref Range   Magnesium 2.1 1.7 - 2.4 mg/dL  Phosphorus     Status: None  Collection Time: 06/25/15  8:02 PM  Result Value Ref Range   Phosphorus 3.6 2.5 - 4.6 mg/dL  Lactic acid, plasma     Status: None   Collection Time: 06/25/15  8:02 PM  Result Value Ref Range   Lactic Acid, Venous 1.5 0.5 - 2.0 mmol/L  CBC     Status: None   Collection Time: 06/25/15  8:02 PM  Result Value Ref Range   WBC 9.1 4.0 - 10.5 K/uL   RBC 4.33 4.22 - 5.81 MIL/uL   Hemoglobin 13.6 13.0 - 17.0 g/dL   HCT 40.1 39.0 - 52.0 %   MCV 92.6 78.0 - 100.0 fL   MCH 31.4 26.0 - 34.0 pg   MCHC 33.9 30.0 - 36.0 g/dL   RDW 13.3 11.5 - 15.5 %   Platelets 227 150 - 400 K/uL  Urinalysis, Routine w reflex microscopic (not at [facility])     Status: None   Collection Time: 06/25/15  9:34 PM  Result Value Ref Range   Color, Urine YELLOW YELLOW   APPearance CLEAR CLEAR   Specific Gravity, Urine 1.007 1.005 - 1.030   pH 6.0 5.0 - 8.0   Glucose, UA NEGATIVE NEGATIVE mg/dL   Hgb urine dipstick NEGATIVE NEGATIVE   Bilirubin Urine NEGATIVE NEGATIVE   Ketones, ur NEGATIVE NEGATIVE mg/dL   Protein, ur NEGATIVE NEGATIVE mg/dL   Urobilinogen, UA  0.2 0.0 - 1.0 mg/dL   Nitrite NEGATIVE NEGATIVE   Leukocytes, UA NEGATIVE NEGATIVE    Comment: MICROSCOPIC NOT DONE ON URINES WITH NEGATIVE PROTEIN, BLOOD, LEUKOCYTES, NITRITE, OR GLUCOSE <1000 mg/dL.  Basic metabolic panel     Status: Abnormal   Collection Time: 06/25/15 11:55 PM  Result Value Ref Range   Sodium 146 (H) 135 - 145 mmol/L   Potassium 3.7 3.5 - 5.1 mmol/L   Chloride 111 101 - 111 mmol/L   CO2 26 22 - 32 mmol/L   Glucose, Bld 92 65 - 99 mg/dL   BUN 6 6 - 20 mg/dL   Creatinine, Ser 1.23 0.61 - 1.24 mg/dL   Calcium 8.9 8.9 - 10.3 mg/dL   GFR calc non Af Amer >60 >60 mL/min   GFR calc Af Amer >60 >60 mL/min    Comment: (NOTE) The eGFR has been calculated using the CKD EPI equation. This calculation has not been validated in all clinical situations. eGFR's persistently <60 mL/min signify possible Chronic Kidney Disease.    Anion gap 9 5 - 15  Glucose, capillary     Status: None   Collection Time: 06/26/15 12:05 AM  Result Value Ref Range   Glucose-Capillary 95 65 - 99 mg/dL  Salicylate level     Status: Abnormal   Collection Time: 06/26/15 12:14 AM  Result Value Ref Range   Salicylate Lvl 68.3 (HH) 2.8 - 30.0 mg/dL    Comment: CRITICAL RESULT CALLED TO, READ BACK BY AND VERIFIED WITH: HANCOCK M,RN 06/26/15 0115 WAYK   Basic metabolic panel     Status: Abnormal   Collection Time: 06/26/15  2:59 AM  Result Value Ref Range   Sodium 143 135 - 145 mmol/L   Potassium 4.1 3.5 - 5.1 mmol/L   Chloride 109 101 - 111 mmol/L   CO2 24 22 - 32 mmol/L   Glucose, Bld 106 (H) 65 - 99 mg/dL   BUN 6 6 - 20 mg/dL   Creatinine, Ser 1.30 (H) 0.61 - 1.24 mg/dL   Calcium 8.5 (L) 8.9 - 10.3 mg/dL   GFR  calc non Af Amer >60 >60 mL/min   GFR calc Af Amer >60 >60 mL/min    Comment: (NOTE) The eGFR has been calculated using the CKD EPI equation. This calculation has not been validated in all clinical situations. eGFR's persistently <60 mL/min signify possible Chronic  Kidney Disease.    Anion gap 10 5 - 15  Salicylate level     Status: None   Collection Time: 06/26/15  2:59 AM  Result Value Ref Range   Salicylate Lvl 44.0 2.8 - 30.0 mg/dL  Glucose, capillary     Status: Abnormal   Collection Time: 06/26/15  4:10 AM  Result Value Ref Range   Glucose-Capillary 102 (H) 65 - 99 mg/dL   Comment 1 Notify RN    Comment 2 Document in Chart   Urinalysis, Routine w reflex microscopic (not at [facility])     Status: None   Collection Time: 06/26/15  5:30 AM  Result Value Ref Range   Color, Urine YELLOW YELLOW   APPearance CLEAR CLEAR   Specific Gravity, Urine 1.009 1.005 - 1.030   pH 7.0 5.0 - 8.0   Glucose, UA NEGATIVE NEGATIVE mg/dL   Hgb urine dipstick NEGATIVE NEGATIVE   Bilirubin Urine NEGATIVE NEGATIVE   Ketones, ur NEGATIVE NEGATIVE mg/dL   Protein, ur NEGATIVE NEGATIVE mg/dL   Urobilinogen, UA 0.2 0.0 - 1.0 mg/dL   Nitrite NEGATIVE NEGATIVE   Leukocytes, UA NEGATIVE NEGATIVE    Comment: MICROSCOPIC NOT DONE ON URINES WITH NEGATIVE PROTEIN, BLOOD, LEUKOCYTES, NITRITE, OR GLUCOSE <1000 mg/dL.  Glucose, capillary     Status: None   Collection Time: 06/26/15  7:52 AM  Result Value Ref Range   Glucose-Capillary 94 65 - 99 mg/dL  Glucose, capillary     Status: Abnormal   Collection Time: 06/26/15  9:04 AM  Result Value Ref Range   Glucose-Capillary 137 (H) 65 - 99 mg/dL   Comment 1 Notify RN     Vitals: Blood pressure 115/71, pulse 76, temperature 97.8 F (36.6 C), temperature source Oral, resp. rate 21, height 5' 7"  (1.702 m), weight 78.1 kg (172 lb 2.9 oz), SpO2 97 %.  Risk to Self: Is patient at risk for suicide?: Yes Risk to Others:   Prior Inpatient Therapy:   Prior Outpatient Therapy:    Current Facility-Administered Medications  Medication Dose Route Frequency Provider Last Rate Last Dose  . 0.9 %  sodium chloride infusion  250 mL Intravenous PRN Corey Harold, NP      . 0.9 %  sodium chloride infusion   Intravenous Continuous Laverle Hobby, MD 50 mL/hr at 06/25/15 2357    . acetaminophen (TYLENOL) tablet 650 mg  650 mg Oral Q6H PRN Chesley Mires, MD      . fentaNYL (SUBLIMAZE) injection 50 mcg  50 mcg Intravenous Q2H PRN Chesley Mires, MD      . heparin injection 5,000 Units  5,000 Units Subcutaneous 3 times per day Chesley Mires, MD      . sodium bicarbonate 150 mEq in dextrose 5 % 1,000 mL infusion   Intravenous Continuous Chesley Mires, MD 50 mL/hr at 06/26/15 1008      Musculoskeletal: Strength & Muscle Tone: decreased Gait & Station: unable to stand Patient leans: N/A  Psychiatric Specialty Exam: Physical Exam as per history and physical  ROS c/o being hungry and upset. Denied nausea, vomiting, abdominal pain, SOB and chest pain. No Fever-chills, No Headache, No changes with Vision or hearing, reports vertigo No problems swallowing  food or Liquids, No Chest pain, Cough or Shortness of Breath, No Abdominal pain, No Nausea or Vommitting, Bowel movements are regular, No Blood in stool or Urine, No dysuria, No new skin rashes or bruises, No new joints pains-aches,  No new weakness, tingling, numbness in any extremity, No recent weight gain or loss, No polyuria, polydypsia or polyphagia,   A full 10 point Review of Systems was done, except as stated above, all other Review of Systems were negative.  Blood pressure 115/71, pulse 76, temperature 97.8 F (36.6 C), temperature source Oral, resp. rate 21, height 5' 7"  (1.702 m), weight 78.1 kg (172 lb 2.9 oz), SpO2 97 %.Body mass index is 26.96 kg/(m^2).  General Appearance: Guarded  Eye Contact::  Good  Speech:  Clear and Coherent  Volume:  Decreased  Mood:  Anxious, Depressed, Hopeless and Irritable  Affect:  Congruent and Depressed  Thought Process:  Coherent and Goal Directed  Orientation:  Full (Time, Place, and Person)  Thought Content:  WDL  Suicidal Thoughts:  Yes.  with intent/plan  Homicidal Thoughts:  No  Memory:  Immediate;   Fair Recent;    Fair Remote;   Fair  Judgement:  Impaired  Insight:  Fair  Psychomotor Activity:  Decreased  Concentration:  Fair  Recall:  Good  Fund of Knowledge:Good  Language: Good  Akathisia:  Negative  Handed:  Right  AIMS (if indicated):     Assets:  Communication Skills Desire for Improvement Financial Resources/Insurance Housing Leisure Time Resilience Social Support Talents/Skills Transportation  ADL's:  Impaired  Cognition: WNL  Sleep:      Medical Decision Making: New problem, with additional work up planned, Review of Psycho-Social Stressors (1), Review or order clinical lab tests (1), Review of Last Therapy Session (1), Review or order medicine tests (1), Review of Medication Regimen & Side Effects (2) and Review of New Medication or Change in Dosage (2)  Treatment Plan Summary: Daily contact with patient to assess and evaluate symptoms and progress in treatment and Medication management  Plan: Safety concern: Continue safety sitter Refer to psych social service for appropriate psych placement No psych medication recommend at this time Substance abuse: Counseled Recommend psychiatric Inpatient admission when medically cleared. Supportive therapy provided about ongoing stressors.  Disposition: Admit to acute psych admission when medically stable.  Remedy Corporan,JANARDHAHA R. 06/26/2015 10:27 AM

## 2015-06-26 NOTE — Progress Notes (Signed)
CRITICAL VALUE ALERT  Critical value received:  Salicylate 34.0  Date of notification:  06/26/2015  Time of notification:  01:15  Critical value read back:Yes.    Nurse who received alert:  Lillia Corporal  MD notified (1st page):  Dr. Nicholos Johns  Time of first page:  01:20  MD notified (2nd page):  Time of second page:  Responding MD:  Dr. Nicholos Johns  Time MD responded:  01:20

## 2015-06-26 NOTE — Progress Notes (Signed)
Owais Pruett [id number]  Transfer Data: 06/26/2015 6:53 PM  Attending Provider: Alyson Reedy, MD  PCP:No PCP Per Patient  Code Status: Full  Greco Gastelum is a 44 y.o. male patient transferred from 67M  -No acute distress noted.  -No complaints of shortness of breath.  -No complaints of chest pain.  Cardiac Monitoring:  Box # 2 in place.  Cardiac monitor yields:normal sinus rhythm.   IV Fluids: IV in place, occlusive dsg intact without redness, IV cath antecubital left, condition patent and no redness  normal saline.  Allergies: Review of patient's allergies indicates no known allergies.  Past Medical History:  has a past medical history of Hyperlipidemia; Herniated disc; and Chronic back pain.  Past Surgical History:  has no past surgical history on file.  Social History:  reports that he has been smoking Cigarettes.  He has been smoking about 10.00 packs per day. He does not have any smokeless tobacco history on file. He reports that he drinks alcohol. He reports that he does not use illicit drugs.    Patient orientated to room. Call light within reach. Sitter at bedside. Belongings labeled and placed in utility room. Pt currently eating dinner. Will continue to evaluate and treat per MD orders.

## 2015-06-26 NOTE — Progress Notes (Signed)
   Name: Leonard Martinez MRN: [id number] DOB: 04-09-1971    ADMISSION DATE:  06/25/2015  REFERRING MD :  EDP  CHIEF COMPLAINT:  Salicylate toxicity   BRIEF PATIENT DESCRIPTION:  44 yo male with intentional ASA overdose.  Hx of chronic pain.  SIGNIFICANT EVENTS: 8/17 Admit  STUDIES:   SUBJECTIVE:  Denies HA, chest pain, abdominal pain, dyspnea.  VITAL SIGNS: Temp:  [97.5 F (36.4 C)-98.3 F (36.8 C)] 97.8 F (36.6 C) (08/18 0806) Pulse Rate:  [66-93] 76 (08/18 0800) Resp:  [15-24] 21 (08/18 0800) BP: (111-148)/(70-95) 115/71 mmHg (08/18 0800) SpO2:  [94 %-99 %] 97 % (08/18 0800) Weight:  [172 lb 2.9 oz (78.1 kg)] 172 lb 2.9 oz (78.1 kg) (08/18 0322)  PHYSICAL EXAMINATION: General: laying in bed Neuro: flat affect, normal strength HEENT: Pupils reactive Cardiovascular: regular Lungs: no wheeze Abdomen:  Soft, non-tender Musculoskeletal: no edema Skin: no rashes   CMP Latest Ref Rng 06/26/2015 06/25/2015 06/25/2015  Glucose 65 - 99 mg/dL 409(W) 92 119(J)  BUN 6 - 20 mg/dL Creatinine 0.61 - 1.24 mg/dL 4.78(G) 9.56 2.13(Y)  Sodium 135 - 145 mmol/L 143 146(H) 144  Potassium 3.5 - 5.1 mmol/L 4.1 3.7 3.6  Chloride 101 - 111 mmol/L 109 111 107  CO2 22 - 32 mmol/L Calcium 8.9 - 10.3 mg/dL 8.6(V) 8.9 7.8(I)  Total Protein 6.5 - 8.1 g/dL - - -  Total Bilirubin 0.3 - 1.2 mg/dL - - -  Alkaline Phos 38 - 126 U/L - - -  AST 15 - 41 U/L - - -  ALT 17 - 63 U/L - - -    CBC Latest Ref Rng 06/25/2015 06/25/2015  WBC 4.0 - 10.5 K/uL 9.1 9.2  Hemoglobin 13.0 - 17.0 g/dL 69.6 29.5  Hematocrit 28.4 - 52.0 % 40.1 41.5  Platelets 150 - 400 K/uL 227 242    ABG    Component Value Date/Time   PHART 7.488* 06/25/2015 1955   PCO2ART 31.8* 06/25/2015 1955   PO2ART 90.7 06/25/2015 1955   HCO3 23.9 06/25/2015 1955   TCO2 24.8 06/25/2015 1955   O2SAT 97.3 06/25/2015 1955    Drugs of Abuse     Component Value Date/Time   LABOPIA NONE DETECTED 06/25/2015 1834   COCAINSCRNUR POSITIVE* 06/25/2015 1834   LABBENZ NONE DETECTED 06/25/2015 1834   AMPHETMU NONE DETECTED 06/25/2015 1834   THCU POSITIVE* 06/25/2015 1834   LABBARB NONE DETECTED 06/25/2015 1834     CBG (last 3)   Recent Labs  06/26/15 0410 06/26/15 0752 06/26/15 0904  GLUCAP 102* 94 137*      ASSESSMENT / PLAN:  Intentional overdose with ASA. Polysubstance abuse >> UDS positive for cocaine, THC. Plan: - consult psychiatry - continue HCO3 in IV fluid for now - monitor renal fx, urine outpt, electrolytes - advance diet as tolerated  Chronic pain. Plan: - prn fentanyl   Will transfer to telemetry 8/18.  Will ask [facility] to assume care from 8/19 and PCCM off.  Coralyn Helling, MD [facility] Pulmonary/Critical Care 06/26/2015, 10:05 AM Pager:  [phone] After 3pm call: [phone]

## 2015-06-26 NOTE — Progress Notes (Signed)
Pts cell phone removed from room and placed in hardcopy chart

## 2015-06-26 NOTE — Clinical Social Work Note (Signed)
CSW spoke with DA office [phone] regarding patient's court hearing that he is supposed to go to per patient's request.  DA office said to have patient bring discharge paperwork to office once he is discharged.  CSW informed patient about what DA office said.  CSW to sign off, please reconsult if other social work needs arise.  Ervin Knack. Phung Kotas, MSW, LCSWA [phone] 06/26/2015 4:02 PM

## 2015-06-26 NOTE — Progress Notes (Signed)
Pt transferred to 5W. VSS. A&Ox4. No issues at this time. Sitter at bedside. Will continue to monitor pt closely. Jillyn Hidden, RN

## 2015-06-26 NOTE — Progress Notes (Addendum)
Pt agitated and tearful most of evening before going to sleep approx 2100. Pts phone and personal belongings removed earlier in evening of 25 Jun 2015 [facility] [place] suicidal patient safety guidelines.   Pt's  "best friend" (name unknown)visited approx 2350, 25 Jun 2015 and stayed briefly without waking or talking to patient.   Pts girlfriend Leonard Martinez from [place] called [phone], 26 Jun 2015 demanding to know Pt's status.   Pt's ex-wife Leonard Martinez called [phone], 26 Jun 2015 and requested the same stating the patients best friend who had just visited told her that the patient was not going to survive the night.   I reassured both callers that patient was sleeping comfortably at this time and when awake is alert and oriented. They were also advised that nursing staff was unable to discuss specifics of thee patients condition. Both were advised that patient does not have any personal belongings in the room and that his cell phone has been removed from the room. Advised both to call back in the morning when patient was awake and able to give consent to discuss his medical condition.

## 2015-06-27 ENCOUNTER — Encounter (HOSPITAL_COMMUNITY): Payer: Self-pay | Admitting: *Deleted

## 2015-06-27 ENCOUNTER — Inpatient Hospital Stay (HOSPITAL_COMMUNITY)
Admission: AD | Admit: 2015-06-27 | Discharge: 2015-07-02 | DRG: 885 | Disposition: A | Discharge status: Home / Self Care | Payer: No Typology Code available for payment source | Attending: Psychiatry | Admitting: Psychiatry

## 2015-06-27 DIAGNOSIS — F1021 Alcohol dependence, in remission: Secondary | ICD-10-CM | POA: Diagnosis present

## 2015-06-27 DIAGNOSIS — F329 Major depressive disorder, single episode, unspecified: Secondary | ICD-10-CM | POA: Diagnosis present

## 2015-06-27 DIAGNOSIS — R45851 Suicidal ideations: Secondary | ICD-10-CM | POA: Diagnosis present

## 2015-06-27 DIAGNOSIS — F1210 Cannabis abuse, uncomplicated: Secondary | ICD-10-CM | POA: Diagnosis present

## 2015-06-27 DIAGNOSIS — F322 Major depressive disorder, single episode, severe without psychotic features: Secondary | ICD-10-CM | POA: Diagnosis present

## 2015-06-27 DIAGNOSIS — F1410 Cocaine abuse, uncomplicated: Secondary | ICD-10-CM | POA: Diagnosis present

## 2015-06-27 DIAGNOSIS — T39092A Poisoning by salicylates, intentional self-harm, initial encounter: Secondary | ICD-10-CM

## 2015-06-27 DIAGNOSIS — F17200 Nicotine dependence, unspecified, uncomplicated: Secondary | ICD-10-CM | POA: Diagnosis present

## 2015-06-27 DIAGNOSIS — F32A Depression, unspecified: Secondary | ICD-10-CM | POA: Diagnosis present

## 2015-06-27 DIAGNOSIS — T39012A Poisoning by aspirin, intentional self-harm, initial encounter: Secondary | ICD-10-CM | POA: Diagnosis not present

## 2015-06-27 DIAGNOSIS — F411 Generalized anxiety disorder: Secondary | ICD-10-CM | POA: Diagnosis present

## 2015-06-27 DIAGNOSIS — F17210 Nicotine dependence, cigarettes, uncomplicated: Secondary | ICD-10-CM | POA: Diagnosis present

## 2015-06-27 DIAGNOSIS — T1491XA Suicide attempt, initial encounter: Secondary | ICD-10-CM | POA: Diagnosis present

## 2015-06-27 DIAGNOSIS — F1099 Alcohol use, unspecified with unspecified alcohol-induced disorder: Secondary | ICD-10-CM | POA: Diagnosis not present

## 2015-06-27 DIAGNOSIS — T1491 Suicide attempt: Secondary | ICD-10-CM | POA: Diagnosis not present

## 2015-06-27 DIAGNOSIS — F1220 Cannabis dependence, uncomplicated: Secondary | ICD-10-CM | POA: Diagnosis present

## 2015-06-27 LAB — COMPREHENSIVE METABOLIC PANEL
ALT: 18 U/L (ref 17–63)
ANION GAP: 6 (ref 5–15)
AST: 18 U/L (ref 15–41)
Albumin: 2.9 g/dL — ABNORMAL LOW (ref 3.5–5.0)
Alkaline Phosphatase: 66 U/L (ref 38–126)
BUN: <5 mg/dL — ABNORMAL LOW (ref 6–20)
CHLORIDE: 105 mmol/L (ref 101–111)
CO2: 29 mmol/L (ref 22–32)
CREATININE: 1.19 mg/dL (ref 0.61–1.24)
Calcium: 8.4 mg/dL — ABNORMAL LOW (ref 8.9–10.3)
Glucose, Bld: 91 mg/dL (ref 65–99)
POTASSIUM: 3.5 mmol/L (ref 3.5–5.1)
Sodium: 140 mmol/L (ref 135–145)
Total Bilirubin: 0.5 mg/dL (ref 0.3–1.2)
Total Protein: 5 g/dL — ABNORMAL LOW (ref 6.5–8.1)

## 2015-06-27 LAB — CBC
HCT: 38.2 % — ABNORMAL LOW (ref 39.0–52.0)
Hemoglobin: 13.1 g/dL (ref 13.0–17.0)
MCH: 31.6 pg (ref 26.0–34.0)
MCHC: 34.3 g/dL (ref 30.0–36.0)
MCV: 92 fL (ref 78.0–100.0)
PLATELETS: 206 10*3/uL (ref 150–400)
RBC: 4.15 MIL/uL — ABNORMAL LOW (ref 4.22–5.81)
RDW: 13.2 % (ref 11.5–15.5)
WBC: 7 10*3/uL (ref 4.0–10.5)

## 2015-06-27 LAB — URINALYSIS, ROUTINE W REFLEX MICROSCOPIC
Bilirubin Urine: NEGATIVE
Glucose, UA: NEGATIVE mg/dL
Hgb urine dipstick: NEGATIVE
KETONES UR: NEGATIVE mg/dL
LEUKOCYTES UA: NEGATIVE
NITRITE: NEGATIVE
PROTEIN: NEGATIVE mg/dL
Specific Gravity, Urine: 1.016 (ref 1.005–1.030)
UROBILINOGEN UA: 1 mg/dL (ref 0.0–1.0)
pH: 8 (ref 5.0–8.0)

## 2015-06-27 LAB — SALICYLATE LEVEL: Salicylate Lvl: <4 mg/dL (ref 2.8–30.0)

## 2015-06-27 LAB — PHOSPHORUS: PHOSPHORUS: 3.5 mg/dL (ref 2.5–4.6)

## 2015-06-27 LAB — MAGNESIUM: MAGNESIUM: 1.9 mg/dL (ref 1.7–2.4)

## 2015-06-27 MED ORDER — ALUM & MAG HYDROXIDE-SIMETH 200-200-20 MG/5ML PO SUSP: 30.0000 mL | ORAL | Status: DC | PRN

## 2015-06-27 MED ORDER — HYDROXYZINE HCL 50 MG PO TABS
50.0000 mg | ORAL_TABLET | Freq: Three times a day (TID) | ORAL | Status: DC | PRN
  Administered 2015-06-27 – 2015-06-28 (×2): 50 mg via ORAL
  Filled 2015-06-27 (×2): qty 1

## 2015-06-27 MED ORDER — ACETAMINOPHEN 325 MG PO TABS
650.0000 mg | ORAL_TABLET | Freq: Four times a day (QID) | ORAL | Status: DC | PRN
Start: 2015-06-27 — End: 2015-07-02
  Administered 2015-06-27 – 2015-07-01 (×3): 650 mg via ORAL
  Filled 2015-06-27 (×3): qty 2

## 2015-06-27 MED ORDER — MAGNESIUM HYDROXIDE 400 MG/5ML PO SUSP: 30.0000 mL | Freq: Every day | ORAL | Status: DC | PRN

## 2015-06-27 MED ORDER — NICOTINE 21 MG/24HR TD PT24: 21.0000 mg | MEDICATED_PATCH | Freq: Every day | TRANSDERMAL | Status: DC

## 2015-06-27 MED ORDER — ACETAMINOPHEN 325 MG PO TABS: 650.0000 mg | ORAL_TABLET | Freq: Four times a day (QID) | ORAL | Status: DC | PRN

## 2015-06-27 MED ORDER — TRAZODONE HCL 50 MG PO TABS
50.0000 mg | ORAL_TABLET | Freq: Every evening | ORAL | Status: DC | PRN
  Administered 2015-06-27 – 2015-06-29 (×3): 50 mg via ORAL
  Filled 2015-06-27 (×4): qty 1

## 2015-06-27 NOTE — Clinical Social Work Psych Note (Addendum)
Patient has been accepted to [facility]. RN to call report to [phone] Transportation: [employer] as patient is IVC'd (paperwork has been served and is on the chart) RN to contact MD to make aware of discharge. IVC paperwork has been faxed to [facility] RN review.  Vickii Penna, LCSW [phone]  Psychiatric & Orthopedics (5N 1-8) Clinical Social Worker

## 2015-06-27 NOTE — Progress Notes (Deleted)
Patient Demographics  Leonard Martinez, is a 44 y.o. male, DOB - [birth year], ZOX:[id number]  Admit date - 06/25/2015   Admitting Physician Alyson Reedy, MD  Outpatient Primary MD for the patient is No PCP Per Patient  LOS - 2   Chief Complaint  Patient presents with  . Ingestion  . Suicidal         Subjective:   Leonard Martinez today has, No headache, No chest pain, No abdominal pain - No Nausea, No new weakness tingling or numbness, No Cough - SOB.  Assessment & Plan    Principal Problem:   Psychoactive substance-induced mood disorder Active Problems:   Salicylate overdose  Suicide attempt/intentional overdose with aspirin - Psychiatry consult greatly appreciated, will need to be admitted for acute inpatient psych, psychiatric social worker is aware, and working on locating a facility. - Daniel suicide precaution, safety Center - Bicarbonate drip has been stopped, even normal salicylate level, and bicarbonate level of 29,  - Patient is clear for discharge to psychiatric facility as per psychiatric recommendation  polysubstance abuse - urine drug Screen tested positive for THC and cocaine, he denies any cocaine use, patient was counseled.  Code Status: full  Family Communication: none at bedside  Disposition Plan: Inpatient psych when bed is available   Procedures  none   Consults   Psych PCCM   Medications  Scheduled Meds: . heparin subcutaneous  5,000 Units Subcutaneous 3 times per day   Continuous Infusions: . sodium chloride 50 mL/hr at 06/27/15 0612   PRN Meds:.sodium chloride, acetaminophen  DVT Prophylaxis   Heparin - SCDs   Lab Results  Component Value Date   PLT 206 06/27/2015    Antibiotics    Anti-infectives    None          Objective:   Filed Vitals:   06/26/15 1700 06/26/15 1852 06/26/15 2158 06/27/15 0519  BP: 117/58 131/84 121/60  132/68  Pulse: 68 69 70 60  Temp:  98.3 F (36.8 C) 98.7 F (37.1 C) 98.1 F (36.7 C)  TempSrc:  Oral Oral Oral  Resp: 24 18 20 18   Height:      Weight:      SpO2: 98% 98% 97% 97%    Wt Readings from Last 3 Encounters:  06/26/15 78.1 kg (172 lb 2.9 oz)  03/27/14 74.844 kg (165 lb)  08/19/13 83.099 kg (183 lb 3.2 oz)     Intake/Output Summary (Last 24 hours) at 06/27/15 1332 Last data filed at 06/27/15 1309  Gross per 24 hour  Intake 2651.66 ml  Output      0 ml  Net 2651.66 ml     Physical Exam  Awake Alert, Oriented X 3,  [place].AT,PERRAL Supple Neck,No JVD, No cervical lymphadenopathy appriciated.  Symmetrical Chest wall movement, Good air movement bilaterally, CTAB RRR,No Gallops,Rubs or new Murmurs, No Parasternal Heave +ve B.Sounds, Abd Soft, No tenderness, No organomegaly appriciated, No rebound - guarding or rigidity. No Cyanosis, Clubbing or edema, No new Rash or bruise    Data Review   Micro Results Recent Results (from the past 240 hour(s))  MRSA PCR Screening     Status: None   Collection Time: 06/25/15  6:55 PM  Result Value Ref Range  Status   MRSA by PCR NEGATIVE NEGATIVE Final    Comment:        The GeneXpert MRSA Assay (FDA approved for NASAL specimens only), is one component of a comprehensive MRSA colonization surveillance program. It is not intended to diagnose MRSA infection nor to guide or monitor treatment for MRSA infections.     Radiology Reports No results found.   CBC  Recent Labs Lab 06/25/15 1408 06/25/15 2002 06/27/15 0504  WBC 9.2 9.1 7.0  HGB 13.8 13.6 13.1  HCT 41.5 40.1 38.2*  PLT 242 227 206  MCV 91.8 92.6 92.0  MCH 30.5 31.4 31.6  MCHC 33.3 33.9 34.3  RDW 13.2 13.3 13.2    Chemistries   Recent Labs Lab 06/25/15 1408 06/25/15 2002 06/25/15 2355 06/26/15 0259 06/27/15 0504  NA 140 144 146* 143 140  K 3.8 3.6 3.7 4.1 3.5  CL 105 107 111 109 105  CO2 GLUCOSE 89 108* 92 106* 91  BUN  <5*  CREATININE 1.02 1.28* 1.23 1.30* 1.19  CALCIUM 9.1 8.7* 8.9 8.5* 8.4*  MG  --  2.1  --   --  1.9  AST 15  --   --   --  18  ALT 14*  --   --   --  18  ALKPHOS 83  --   --   --  66  BILITOT 1.1  --   --   --  0.5   ------------------------------------------------------------------------------------------------------------------ estimated creatinine clearance is 75.6 mL/min (by C-G formula based on Cr of 1.19). ------------------------------------------------------------------------------------------------------------------ No results for input(s): HGBA1C in the last 72 hours. ------------------------------------------------------------------------------------------------------------------ No results for input(s): CHOL, HDL, LDLCALC, TRIG, CHOLHDL, LDLDIRECT in the last 72 hours. ------------------------------------------------------------------------------------------------------------------ No results for input(s): TSH, T4TOTAL, T3FREE, THYROIDAB in the last 72 hours.  Invalid input(s): FREET3 ------------------------------------------------------------------------------------------------------------------ No results for input(s): VITAMINB12, FOLATE, FERRITIN, TIBC, IRON, RETICCTPCT in the last 72 hours.  Coagulation profile No results for input(s): INR, PROTIME in the last 168 hours.  No results for input(s): DDIMER in the last 72 hours.  Cardiac Enzymes No results for input(s): CKMB, TROPONINI, MYOGLOBIN in the last 168 hours.  Invalid input(s): CK ------------------------------------------------------------------------------------------------------------------ Invalid input(s): POCBNP     Time Spent in minutes   25 minutes   Chevelle Coulson M.D on 06/27/2015 at 1:32 PM  Between 7am to 7pm - Pager - [phone]  After 7pm go to [email or web address] - password [facility]  [facility]   Office  [phone]

## 2015-06-27 NOTE — Progress Notes (Signed)
Adult Psychoeducational Group Note  Date:  06/27/2015 Time:  8:48 PM  Group Topic/Focus:  Wrap-Up Group:   The focus of this group is to help patients review their daily goal of treatment and discuss progress on daily workbooks.  Participation Level:  Active  Participation Quality:  Appropriate  Affect:  Appropriate  Cognitive:  Appropriate  Insight: Appropriate  Engagement in Group:  Engaged  Modes of Intervention:  Discussion  Additional Comments:  Pt stated that goal for today was to get help. Pt stated that he achieved goal by coming to hospital.  Margorie John 06/27/2015, 8:48 PM

## 2015-06-27 NOTE — Care Management Note (Signed)
Case Management Note  Patient Details  Name: Leonard Martinez MRN: [id number] Date of Birth: [birth year]  Subjective/Objective:                Spoke with patient, sitter at bedside, on suicide precautions.  Patient states that he has been living in [place], [place] with his aunt for the last month and a half. Per patient he came to [facility] on August 15th for custody hearings and has been staying with his friend Broadus John. He states that he plans on going back to [facility] after he is released from the hospital. He has another custody court case on the 26th in [place] that he states he may or may not go to, and will stay with his friend Broadus John until that time if he decides to stay. Patient states that his wife has filed for a protection order against him and he is not allowed to contact her or go get his car that he left in the parking lot of her employment. Per Burnett Corrente, consult previously placed, [facility] aware of case. Patient declined to be followed at [facility] at this time as he intends to return to [facility].  Action/Plan:  Will continue to follow and offer resources as needed.   Expected Discharge Date:   (unknown)               Expected Discharge Plan:  Psychiatric Hospital  In-House Referral:  Clinical Social Work  Discharge planning Services  CM Consult  Post Acute Care Choice:    Choice offered to:  Patient  DME Arranged:    DME Agency:     HH Arranged:    HH Agency:     Status of Service:  In process, will continue to follow  Medicare Important Message Given:    Date Medicare IM Given:    Medicare IM give by:    Date Additional Medicare IM Given:    Additional Medicare Important Message give by:     If discussed at Long Length of Stay Meetings, dates discussed:    Additional Comments:  Leonard Sabal, RN 06/27/2015, 10:31 AM

## 2015-06-27 NOTE — Progress Notes (Addendum)
Patient ID: Leonard Martinez, male   DOB: 02/11/1971, 44 y.o.   MRN: [id number] Pt is a 44 year old involuntary admit to [facility]. He has NKDA. Pt has a hx of tonsillectomy, herniated disc and chronic back pain.Pt stated,"This divorce is killing me. My wife has all the money and I do not even have an attorney. She and the judge are painting me out as the worse human being. I live in [place] now and visitation to see my two girls is in [place] twice a week for 2 hours. There is no way I can drive up for 8 hours to see my girls for 2 hours." Pt stated he has been married times 12 years and that he did cocaine and his wife found out. Pt admits to trying to kill himself due to the stress of not getting to see his 44 and 42 year old girls. Pt admitted to taking 74 ASA and drinking beer. He admits to doing cocaine  1 time in 15 years. He does contract for safety and stated once he leaves he will go back to [place] to live with his aunt. Pt is very pleasant and cooperative. He stated,"my wife is a good mom I just think her lawyer is driving all of this." Pt stated,"I have not had a cigarette in 3 days I think this is a good time to stop smoking."

## 2015-06-27 NOTE — Discharge Instructions (Signed)
Follow with Primary MD   Get CBC, CMP, in 3 days.   Activity: As tolerated with Full fall precautions use walker/cane & assistance as needed   Disposition [facility]   Diet: regular, with feeding assistance and aspiration precautions.  For Heart failure patients - Check your Weight same time everyday, if you gain over 2 pounds, or you develop in leg swelling, experience more shortness of breath or chest pain, call your Primary MD immediately. Follow Cardiac Low Salt Diet and 1.5 lit/day fluid restriction.   On your next visit with your primary care physician please Get Medicines reviewed and adjusted.   Please request your Prim.MD to go over all Hospital Tests and Procedure/Radiological results at the follow up, please get all Hospital records sent to your Prim MD by signing hospital release before you go home.   If you experience worsening of your admission symptoms, develop shortness of breath, life threatening emergency, suicidal or homicidal thoughts you must seek medical attention immediately by calling 911 or calling your MD immediately  if symptoms less severe.  You Must read complete instructions/literature along with all the possible adverse reactions/side effects for all the Medicines you take and that have been prescribed to you. Take any new Medicines after you have completely understood and accpet all the possible adverse reactions/side effects.   Do not drive, operating heavy machinery, perform activities at heights, swimming or participation in water activities or provide baby sitting services if your were admitted for syncope or siezures until you have seen by Primary MD or a Neurologist and advised to do so again.  Do not drive when taking Pain medications.    Do not take more than prescribed Pain, Sleep and Anxiety Medications  Special Instructions: If you have smoked or chewed Tobacco  in the last 2 yrs please stop smoking, stop any regular Alcohol  and or any  Recreational drug use.  Wear Seat belts while driving.   Please note  You were cared for by a hospitalist during your hospital stay. If you have any questions about your discharge medications or the care you received while you were in the hospital after you are discharged, you can call the unit and asked to speak with the hospitalist on call if the hospitalist that took care of you is not available. Once you are discharged, your primary care physician will handle any further medical issues. Please note that NO REFILLS for any discharge medications will be authorized once you are discharged, as it is imperative that you return to your primary care physician (or establish a relationship with a primary care physician if you do not have one) for your aftercare needs so that they can reassess your need for medications and monitor your lab values.

## 2015-06-27 NOTE — Discharge Summary (Signed)
Leonard Martinez, is a 44 y.o. male  DOB [birth year]  MRN [id number].  Admission date:  06/25/2015  Admitting Physician  Alyson Reedy, MD  Discharge Date:  06/27/2015   Primary MD  No PCP Per Patient  Recommendations for primary care physician for things to follow:  - CBC, CMP in 3 days   Admission Diagnosis  Salicylate overdose, intentional self-harm, initial encounter [T39.092A]   Discharge Diagnosis  Salicylate overdose, intentional self-harm, initial encounter [T39.092A]    Principal Problem:   Psychoactive substance-induced mood disorder Active Problems:   Salicylate overdose      Past Medical History  Diagnosis Date  . Hyperlipidemia   . Herniated disc   . Chronic back pain     History reviewed. No pertinent past surgical history.     History of present illness and  Hospital Course:     Kindly see H&P for history of present illness and admission details, please review complete Labs, Consult reports and Test reports for all details in brief  HPI  from the history and physical done on the day of admission 06/25/2015 44 yo male with hx chronic pain presented 8/17 after intentional ASA overdose. Pt admitted to taking ~74 325mg  ASA and beer stating that he did not want to live anymore. Suicide note found by wife (separated). PCCM consulted to admit for close monitoring and ?HD.     Hospital Course   Suicide attempt/intentional overdose with aspirin - Psychiatry consult greatly appreciated, will need to be admitted for acute inpatient psych, - on  suicide precaution, safety Center during hospital stay - Bicarbonate drip has been stopped, as normal salicylate level, and bicarbonate level of 29,  - Patient is clear for discharge to psychiatric facility as per psychiatric recommendation  polysubstance abuse - urine drug Screen tested positive for THC and cocaine, he denies any  cocaine use, patient was counseled.    Discharge Condition:  stable   Follow UP      Discharge Instructions  and  Discharge Medications    Discharge Instructions    Discharge instructions    Complete by:  As directed   Follow with Primary MD   Get CBC, CMP, in 3 days.   Activity: As tolerated with Full fall precautions use walker/cane & assistance as needed   Disposition [facility]   Diet: regular, with feeding assistance and aspiration precautions.  For Heart failure patients - Check your Weight same time everyday, if you gain over 2 pounds, or you develop in leg swelling, experience more shortness of breath or chest pain, call your Primary MD immediately. Follow Cardiac Low Salt Diet and 1.5 lit/day fluid restriction.   On your next visit with your primary care physician please Get Medicines reviewed and adjusted.   Please request your Prim.MD to go over all Hospital Tests and Procedure/Radiological results at the follow up, please get all Hospital records sent to your Prim MD by signing hospital release before you go home.   If you experience worsening of your admission  symptoms, develop shortness of breath, life threatening emergency, suicidal or homicidal thoughts you must seek medical attention immediately by calling 911 or calling your MD immediately  if symptoms less severe.  You Must read complete instructions/literature along with all the possible adverse reactions/side effects for all the Medicines you take and that have been prescribed to you. Take any new Medicines after you have completely understood and accpet all the possible adverse reactions/side effects.   Do not drive, operating heavy machinery, perform activities at heights, swimming or participation in water activities or provide baby sitting services if your were admitted for syncope or siezures until you have seen by Primary MD or a Neurologist and advised to do so again.  Do not drive when taking Pain  medications.    Do not take more than prescribed Pain, Sleep and Anxiety Medications  Special Instructions: If you have smoked or chewed Tobacco  in the last 2 yrs please stop smoking, stop any regular Alcohol  and or any Recreational drug use.  Wear Seat belts while driving.   Please note  You were cared for by a hospitalist during your hospital stay. If you have any questions about your discharge medications or the care you received while you were in the hospital after you are discharged, you can call the unit and asked to speak with the hospitalist on call if the hospitalist that took care of you is not available. Once you are discharged, your primary care physician will handle any further medical issues. Please note that NO REFILLS for any discharge medications will be authorized once you are discharged, as it is imperative that you return to your primary care physician (or establish a relationship with a primary care physician if you do not have one) for your aftercare needs so that they can reassess your need for medications and monitor your lab values.     Increase activity slowly    Complete by:  As directed             Medication List    Notice    You have not been prescribed any medications.        Diet and Activity recommendation: See Discharge Instructions above   Consults obtained -  Psychiatry PCCM   Major procedures and Radiology Reports - PLEASE review detailed and final reports for all details, in brief -      No results found.  Micro Results     Recent Results (from the past 240 hour(s))  MRSA PCR Screening     Status: None   Collection Time: 06/25/15  6:55 PM  Result Value Ref Range Status   MRSA by PCR NEGATIVE NEGATIVE Final    Comment:        The GeneXpert MRSA Assay (FDA approved for NASAL specimens only), is one component of a comprehensive MRSA colonization surveillance program. It is not intended to diagnose MRSA infection nor to  guide or monitor treatment for MRSA infections.        Today   Subjective:   Leander Tout today has no headache,no chest abdominal pain,no new weakness tingling or numbness,  Objective:   Blood pressure 122/65, pulse 73, temperature 98.6 F (37 C), temperature source Oral, resp. rate 18, height  (1.702 m), weight 78.1 kg (172 lb 2.9 oz), SpO2 96 %.   Intake/Output Summary (Last 24 hours) at 06/27/15 1551 Last data filed at 06/27/15 1309  Gross per 24 hour  Intake 2551.66 ml  Output  0 ml  Net 2551.66 ml    Exam Awake Alert, Oriented x 3,  [place].AT,PERRAL Supple Neck,No JVD, No cervical lymphadenopathy appriciated.  Symmetrical Chest wall movement, Good air movement bilaterally, CTAB RRR,No Gallops,Rubs or new Murmurs, No Parasternal Heave +ve B.Sounds, Abd Soft, Non tender, No organomegaly appriciated, No rebound -guarding or rigidity. No Cyanosis, Clubbing or edema, No new Rash or bruise  Data Review   CBC w Diff: Lab Results  Component Value Date   WBC 7.0 06/27/2015   HGB 13.1 06/27/2015   HCT 38.2* 06/27/2015   PLT 206 06/27/2015    CMP: Lab Results  Component Value Date   NA 140 06/27/2015   K 3.5 06/27/2015   CL 105 06/27/2015   CO2 29 06/27/2015   BUN <5* 06/27/2015   CREATININE 1.19 06/27/2015   PROT 5.0* 06/27/2015   ALBUMIN 2.9* 06/27/2015   BILITOT 0.5 06/27/2015   ALKPHOS 66 06/27/2015   AST 18 06/27/2015   ALT 18 06/27/2015  .   Total Time in preparing paper work, data evaluation and todays exam - 35 minutes  Vicktoria Muckey M.D on 06/27/2015 at 3:51 PM  [facility]   Office  [phone]

## 2015-06-27 NOTE — Consult Note (Signed)
[facility] Face-to-Face Psychiatry Consult follow-up  Reason for Consult:  Cocaine and THC abuse and intentional ASA overdose Referring Physician:  DR. Nelda Marseille Patient Identification: Leonard Martinez MRN:  [id number] Principal Diagnosis: Psychoactive substance-induced mood disorder Diagnosis:   Patient Active Problem List   Diagnosis Date Noted  . Psychoactive substance-induced mood disorder [F19.94, F06.30] 06/26/2015  . Salicylate overdose [X64.680H] 06/25/2015    Total Time spent with patient: 20 minutes  Subjective:   [name] is a 44 y.o. male patient admitted with intentional overdose.  HPI:  Leonard Martinez is a 44 yo male admitted to [facility] with intentional overdose of ASA and intoxicated with cocaine and cannabis. He presented 8/17 after intentional ASA overdose. Pt admitted to taking ~74 $RemoveBe'325mg'IOOQPQcHm$  ASA and beer stating that he did not want to live anymore. Suicide note found by wife (separated). Patient endorses depression and intentional oversoe of medication to end his life due to separation from his wife and children was taken away from him. Pts wife was just awarded full custody of their children 8/16 and he was granted only supervised visits. He is also irritable, angry and hungry, does not want to cooperate at this time but asking for food tray. Reportedly he was place on NPO due to altered mental state and now he is able stay awake, alert and able to ea, so will be ordered food tray as per the primary MD. He can not contract for safety. BAl is not significant and UDS is positive for cocaine and cannabis. Salicylate levels increased high at 36.4 mg/dL  Interval history: Patient seen for psychiatric consultation follow-up today. Patient continued to endorse symptoms of depression, insomnia, suicidal thoughts since he has been stressed about separation from his wife and 49 years old stepson and 62 and 8 disorder children. Patient reported his wife kicked him out of the home  because of drug abuse about month and half ago. Reportedly patient went to [place] where he has multiple other family members and working in Copywriter, advertising. Patient came to [facility] to attend court date and then overdosed while staying with friend and contacted his wife by text message about suicidal attempt. Patient cannot contract for safety at this time. Patient continued to meet criteria for inpatient psychiatric hospitalization for safety monitoring, crisis evaluation and treatment of depression and substance abuse   Past Medical History:  Past Medical History  Diagnosis Date  . Hyperlipidemia   . Herniated disc   . Chronic back pain    History reviewed. No pertinent past surgical history. Family History:  Family History  Problem Relation Age of Onset  . Bladder Cancer Mother   . Pancreatic cancer Father    Social History:  History  Alcohol Use  . Yes     History  Drug Use No    Social History   Social History  . Marital Status: Married    Spouse Name: N/A  . Number of Children: N/A  . Years of Education: N/A   Social History Main Topics  . Smoking status: Current Every Day Smoker -- 10.00 packs/day    Types: Cigarettes  . Smokeless tobacco: None  . Alcohol Use: Yes  . Drug Use: No  . Sexual Activity: Yes   Other Topics Concern  . None   Social History Narrative   Additional Social History:                          Allergies:  No  Known Allergies  Labs:  Results for orders placed or performed during the hospital encounter of 06/25/15 (from the past 48 hour(s))  Comprehensive metabolic panel     Status: Abnormal   Collection Time: 06/25/15  2:08 PM  Result Value Ref Range   Sodium 140 135 - 145 mmol/L   Potassium 3.8 3.5 - 5.1 mmol/L   Chloride 105 101 - 111 mmol/L   CO2 25 22 - 32 mmol/L   Glucose, Bld 89 65 - 99 mg/dL   BUN 7 6 - 20 mg/dL   Creatinine, Ser 1.02 0.61 - 1.24 mg/dL   Calcium 9.1 8.9 - 10.3 mg/dL   Total Protein 7.0 6.5  - 8.1 g/dL   Albumin 4.1 3.5 - 5.0 g/dL   AST 15 15 - 41 U/L   ALT 14 (L) 17 - 63 U/L   Alkaline Phosphatase 83 38 - 126 U/L   Total Bilirubin 1.1 0.3 - 1.2 mg/dL   GFR calc non Af Amer >60 >60 mL/min   GFR calc Af Amer >60 >60 mL/min    Comment: (NOTE) The eGFR has been calculated using the CKD EPI equation. This calculation has not been validated in all clinical situations. eGFR's persistently <60 mL/min signify possible Chronic Kidney Disease.    Anion gap 10 5 - 15  Ethanol (ETOH)     Status: None   Collection Time: 06/25/15  2:08 PM  Result Value Ref Range   Alcohol, Ethyl (B) <5 <5 mg/dL    Comment:        LOWEST DETECTABLE LIMIT FOR SERUM ALCOHOL IS 5 mg/dL FOR MEDICAL PURPOSES ONLY   Salicylate level     Status: None   Collection Time: 06/25/15  2:08 PM  Result Value Ref Range   Salicylate Lvl 40.1 2.8 - 30.0 mg/dL  Acetaminophen level     Status: Abnormal   Collection Time: 06/25/15  2:08 PM  Result Value Ref Range   Acetaminophen (Tylenol), Serum <10 (L) 10 - 30 ug/mL    Comment:        THERAPEUTIC CONCENTRATIONS VARY SIGNIFICANTLY. A RANGE OF 10-30 ug/mL MAY BE AN EFFECTIVE CONCENTRATION FOR MANY PATIENTS. HOWEVER, SOME ARE BEST TREATED AT CONCENTRATIONS OUTSIDE THIS RANGE. ACETAMINOPHEN CONCENTRATIONS >150 ug/mL AT 4 HOURS AFTER INGESTION AND >50 ug/mL AT 12 HOURS AFTER INGESTION ARE OFTEN ASSOCIATED WITH TOXIC REACTIONS.   CBC     Status: None   Collection Time: 06/25/15  2:08 PM  Result Value Ref Range   WBC 9.2 4.0 - 10.5 K/uL   RBC 4.52 4.22 - 5.81 MIL/uL   Hemoglobin 13.8 13.0 - 17.0 g/dL   HCT 41.5 39.0 - 52.0 %   MCV 91.8 78.0 - 100.0 fL   MCH 30.5 26.0 - 34.0 pg   MCHC 33.3 30.0 - 36.0 g/dL   RDW 13.2 11.5 - 15.5 %   Platelets 242 150 - 400 K/uL  CBG monitoring, ED     Status: Abnormal   Collection Time: 06/25/15  4:50 PM  Result Value Ref Range   Glucose-Capillary 103 (H) 65 - 99 mg/dL  Urine rapid drug screen (hosp performed) (Not  at [facility])     Status: Abnormal   Collection Time: 06/25/15  6:34 PM  Result Value Ref Range   Opiates NONE DETECTED NONE DETECTED   Cocaine POSITIVE (A) NONE DETECTED   Benzodiazepines NONE DETECTED NONE DETECTED   Amphetamines NONE DETECTED NONE DETECTED   Tetrahydrocannabinol POSITIVE (A) NONE DETECTED   Barbiturates  NONE DETECTED NONE DETECTED    Comment:        DRUG SCREEN FOR MEDICAL PURPOSES ONLY.  IF CONFIRMATION IS NEEDED FOR ANY PURPOSE, NOTIFY LAB WITHIN 5 DAYS.        LOWEST DETECTABLE LIMITS FOR URINE DRUG SCREEN Drug Class       Cutoff (ng/mL) Amphetamine      1000 Barbiturate      200 Benzodiazepine   161 Tricyclics       096 Opiates          300 Cocaine          300 THC              50   Urinalysis, Routine w reflex microscopic (not at [facility])     Status: None   Collection Time: 06/25/15  6:34 PM  Result Value Ref Range   Color, Urine YELLOW YELLOW   APPearance CLEAR CLEAR   Specific Gravity, Urine 1.009 1.005 - 1.030   pH 6.0 5.0 - 8.0   Glucose, UA NEGATIVE NEGATIVE mg/dL   Hgb urine dipstick NEGATIVE NEGATIVE   Bilirubin Urine NEGATIVE NEGATIVE   Ketones, ur NEGATIVE NEGATIVE mg/dL   Protein, ur NEGATIVE NEGATIVE mg/dL   Urobilinogen, UA 0.2 0.0 - 1.0 mg/dL   Nitrite NEGATIVE NEGATIVE   Leukocytes, UA NEGATIVE NEGATIVE    Comment: MICROSCOPIC NOT DONE ON URINES WITH NEGATIVE PROTEIN, BLOOD, LEUKOCYTES, NITRITE, OR GLUCOSE <1000 mg/dL.  MRSA PCR Screening     Status: None   Collection Time: 06/25/15  6:55 PM  Result Value Ref Range   MRSA by PCR NEGATIVE NEGATIVE    Comment:        The GeneXpert MRSA Assay (FDA approved for NASAL specimens only), is one component of a comprehensive MRSA colonization surveillance program. It is not intended to diagnose MRSA infection nor to guide or monitor treatment for MRSA infections.   Glucose, capillary     Status: None   Collection Time: 06/25/15  6:57 PM  Result Value Ref Range   Glucose-Capillary 85 65  - 99 mg/dL  Blood gas, arterial     Status: Abnormal   Collection Time: 06/25/15  7:55 PM  Result Value Ref Range   pH, Arterial 7.488 (H) 7.350 - 7.450   pCO2 arterial 31.8 (L) 35.0 - 45.0 mmHg   pO2, Arterial 90.7 80.0 - 100.0 mmHg   Bicarbonate 23.9 20.0 - 24.0 mEq/L   TCO2 24.8 0 - 100 mmol/L   Acid-Base Excess 0.8 0.0 - 2.0 mmol/L   O2 Saturation 97.3 %   Patient temperature 98.6    Collection site RIGHT RADIAL    Drawn by [id number]    Sample type ARTERIAL DRAW    Allens test (pass/fail) PASS PASS  Salicylate level     Status: Abnormal   Collection Time: 06/25/15  8:02 PM  Result Value Ref Range   Salicylate Lvl 81.1 (HH) 2.8 - 30.0 mg/dL    Comment: CRITICAL RESULT CALLED TO, READ BACK BY AND VERIFIED WITH: C SMITH,RN [id number] 06/25/15 D BRADLEY   Basic metabolic panel     Status: Abnormal   Collection Time: 06/25/15  8:02 PM  Result Value Ref Range   Sodium 144 135 - 145 mmol/L   Potassium 3.6 3.5 - 5.1 mmol/L   Chloride 107 101 - 111 mmol/L   CO2 26 22 - 32 mmol/L   Glucose, Bld 108 (H) 65 - 99 mg/dL   BUN 6 6 - 20  mg/dL   Creatinine, Ser 1.28 (H) 0.61 - 1.24 mg/dL   Calcium 8.7 (L) 8.9 - 10.3 mg/dL   GFR calc non Af Amer >60 >60 mL/min   GFR calc Af Amer >60 >60 mL/min    Comment: (NOTE) The eGFR has been calculated using the CKD EPI equation. This calculation has not been validated in all clinical situations. eGFR's persistently <60 mL/min signify possible Chronic Kidney Disease.    Anion gap 11 5 - 15  Magnesium     Status: None   Collection Time: 06/25/15  8:02 PM  Result Value Ref Range   Magnesium 2.1 1.7 - 2.4 mg/dL  Phosphorus     Status: None   Collection Time: 06/25/15  8:02 PM  Result Value Ref Range   Phosphorus 3.6 2.5 - 4.6 mg/dL  Lactic acid, plasma     Status: None   Collection Time: 06/25/15  8:02 PM  Result Value Ref Range   Lactic Acid, Venous 1.5 0.5 - 2.0 mmol/L  CBC     Status: None   Collection Time: 06/25/15  8:02 PM  Result Value Ref  Range   WBC 9.1 4.0 - 10.5 K/uL   RBC 4.33 4.22 - 5.81 MIL/uL   Hemoglobin 13.6 13.0 - 17.0 g/dL   HCT 40.1 39.0 - 52.0 %   MCV 92.6 78.0 - 100.0 fL   MCH 31.4 26.0 - 34.0 pg   MCHC 33.9 30.0 - 36.0 g/dL   RDW 13.3 11.5 - 15.5 %   Platelets 227 150 - 400 K/uL  Urinalysis, Routine w reflex microscopic (not at [facility])     Status: None   Collection Time: 06/25/15  9:34 PM  Result Value Ref Range   Color, Urine YELLOW YELLOW   APPearance CLEAR CLEAR   Specific Gravity, Urine 1.007 1.005 - 1.030   pH 6.0 5.0 - 8.0   Glucose, UA NEGATIVE NEGATIVE mg/dL   Hgb urine dipstick NEGATIVE NEGATIVE   Bilirubin Urine NEGATIVE NEGATIVE   Ketones, ur NEGATIVE NEGATIVE mg/dL   Protein, ur NEGATIVE NEGATIVE mg/dL   Urobilinogen, UA 0.2 0.0 - 1.0 mg/dL   Nitrite NEGATIVE NEGATIVE   Leukocytes, UA NEGATIVE NEGATIVE    Comment: MICROSCOPIC NOT DONE ON URINES WITH NEGATIVE PROTEIN, BLOOD, LEUKOCYTES, NITRITE, OR GLUCOSE <1000 mg/dL.  Basic metabolic panel     Status: Abnormal   Collection Time: 06/25/15 11:55 PM  Result Value Ref Range   Sodium 146 (H) 135 - 145 mmol/L   Potassium 3.7 3.5 - 5.1 mmol/L   Chloride 111 101 - 111 mmol/L   CO2 26 22 - 32 mmol/L   Glucose, Bld 92 65 - 99 mg/dL   BUN 6 6 - 20 mg/dL   Creatinine, Ser 1.23 0.61 - 1.24 mg/dL   Calcium 8.9 8.9 - 10.3 mg/dL   GFR calc non Af Amer >60 >60 mL/min   GFR calc Af Amer >60 >60 mL/min    Comment: (NOTE) The eGFR has been calculated using the CKD EPI equation. This calculation has not been validated in all clinical situations. eGFR's persistently <60 mL/min signify possible Chronic Kidney Disease.    Anion gap 9 5 - 15  Glucose, capillary     Status: None   Collection Time: 06/26/15 12:05 AM  Result Value Ref Range   Glucose-Capillary 95 65 - 99 mg/dL  Salicylate level     Status: Abnormal   Collection Time: 06/26/15 12:14 AM  Result Value Ref Range   Salicylate Lvl  34.0 (HH) 2.8 - 30.0 mg/dL    Comment: CRITICAL RESULT  CALLED TO, READ BACK BY AND VERIFIED WITH: HANCOCK M,RN 06/26/15 0115 WAYK   Basic metabolic panel     Status: Abnormal   Collection Time: 06/26/15  2:59 AM  Result Value Ref Range   Sodium 143 135 - 145 mmol/L   Potassium 4.1 3.5 - 5.1 mmol/L   Chloride 109 101 - 111 mmol/L   CO2 24 22 - 32 mmol/L   Glucose, Bld 106 (H) 65 - 99 mg/dL   BUN 6 6 - 20 mg/dL   Creatinine, Ser 1.30 (H) 0.61 - 1.24 mg/dL   Calcium 8.5 (L) 8.9 - 10.3 mg/dL   GFR calc non Af Amer >60 >60 mL/min   GFR calc Af Amer >60 >60 mL/min    Comment: (NOTE) The eGFR has been calculated using the CKD EPI equation. This calculation has not been validated in all clinical situations. eGFR's persistently <60 mL/min signify possible Chronic Kidney Disease.    Anion gap 10 5 - 15  Salicylate level     Status: None   Collection Time: 06/26/15  2:59 AM  Result Value Ref Range   Salicylate Lvl 26.2 2.8 - 30.0 mg/dL  Glucose, capillary     Status: Abnormal   Collection Time: 06/26/15  4:10 AM  Result Value Ref Range   Glucose-Capillary 102 (H) 65 - 99 mg/dL   Comment 1 Notify RN    Comment 2 Document in Chart   Urinalysis, Routine w reflex microscopic (not at [facility])     Status: None   Collection Time: 06/26/15  5:30 AM  Result Value Ref Range   Color, Urine YELLOW YELLOW   APPearance CLEAR CLEAR   Specific Gravity, Urine 1.009 1.005 - 1.030   pH 7.0 5.0 - 8.0   Glucose, UA NEGATIVE NEGATIVE mg/dL   Hgb urine dipstick NEGATIVE NEGATIVE   Bilirubin Urine NEGATIVE NEGATIVE   Ketones, ur NEGATIVE NEGATIVE mg/dL   Protein, ur NEGATIVE NEGATIVE mg/dL   Urobilinogen, UA 0.2 0.0 - 1.0 mg/dL   Nitrite NEGATIVE NEGATIVE   Leukocytes, UA NEGATIVE NEGATIVE    Comment: MICROSCOPIC NOT DONE ON URINES WITH NEGATIVE PROTEIN, BLOOD, LEUKOCYTES, NITRITE, OR GLUCOSE <1000 mg/dL.  Glucose, capillary     Status: None   Collection Time: 06/26/15  7:52 AM  Result Value Ref Range   Glucose-Capillary 94 65 - 99 mg/dL  Glucose,  capillary     Status: Abnormal   Collection Time: 06/26/15  9:04 AM  Result Value Ref Range   Glucose-Capillary 137 (H) 65 - 99 mg/dL   Comment 1 Notify RN   Glucose, capillary     Status: None   Collection Time: 06/26/15 11:23 AM  Result Value Ref Range   Glucose-Capillary 78 65 - 99 mg/dL  Glucose, capillary     Status: Abnormal   Collection Time: 06/26/15  3:42 PM  Result Value Ref Range   Glucose-Capillary 107 (H) 65 - 99 mg/dL  CBC     Status: Abnormal   Collection Time: 06/27/15  5:04 AM  Result Value Ref Range   WBC 7.0 4.0 - 10.5 K/uL   RBC 4.15 (L) 4.22 - 5.81 MIL/uL   Hemoglobin 13.1 13.0 - 17.0 g/dL   HCT 38.2 (L) 39.0 - 52.0 %   MCV 92.0 78.0 - 100.0 fL   MCH 31.6 26.0 - 34.0 pg   MCHC 34.3 30.0 - 36.0 g/dL   RDW 13.2 11.5 -  15.5 %   Platelets 206 150 - 400 K/uL  Comprehensive metabolic panel     Status: Abnormal   Collection Time: 06/27/15  5:04 AM  Result Value Ref Range   Sodium 140 135 - 145 mmol/L   Potassium 3.5 3.5 - 5.1 mmol/L   Chloride 105 101 - 111 mmol/L   CO2 29 22 - 32 mmol/L   Glucose, Bld 91 65 - 99 mg/dL   BUN <5 (L) 6 - 20 mg/dL   Creatinine, Ser 1.19 0.61 - 1.24 mg/dL   Calcium 8.4 (L) 8.9 - 10.3 mg/dL   Total Protein 5.0 (L) 6.5 - 8.1 g/dL   Albumin 2.9 (L) 3.5 - 5.0 g/dL   AST 18 15 - 41 U/L   ALT 18 17 - 63 U/L   Alkaline Phosphatase 66 38 - 126 U/L   Total Bilirubin 0.5 0.3 - 1.2 mg/dL   GFR calc non Af Amer >60 >60 mL/min   GFR calc Af Amer >60 >60 mL/min    Comment: (NOTE) The eGFR has been calculated using the CKD EPI equation. This calculation has not been validated in all clinical situations. eGFR's persistently <60 mL/min signify possible Chronic Kidney Disease.    Anion gap 6 5 - 15  Magnesium     Status: None   Collection Time: 06/27/15  5:04 AM  Result Value Ref Range   Magnesium 1.9 1.7 - 2.4 mg/dL  Phosphorus     Status: None   Collection Time: 06/27/15  5:04 AM  Result Value Ref Range   Phosphorus 3.5 2.5 - 4.6  mg/dL  Urinalysis, Routine w reflex microscopic (not at [facility]     Status: Abnormal   Collection Time: 06/27/15  8:34 AM  Result Value Ref Range   Color, Urine YELLOW YELLOW   APPearance CLOUDY (A) CLEAR   Specific Gravity, Urine 1.016 1.005 - 1.030   pH 8.0 5.0 - 8.0   Glucose, UA NEGATIVE NEGATIVE mg/dL   Hgb urine dipstick NEGATIVE NEGATIVE   Bilirubin Urine NEGATIVE NEGATIVE   Ketones, ur NEGATIVE NEGATIVE mg/dL   Protein, ur NEGATIVE NEGATIVE mg/dL   Urobilinogen, UA 1.0 0.0 - 1.0 mg/dL   Nitrite NEGATIVE NEGATIVE   Leukocytes, UA NEGATIVE NEGATIVE    Comment: MICROSCOPIC NOT DONE ON URINES WITH NEGATIVE PROTEIN, BLOOD, LEUKOCYTES, NITRITE, OR GLUCOSE <1000 mg/dL.    Vitals: Blood pressure 132/68, pulse 60, temperature 98.1 F (36.7 C), temperature source Oral, resp. rate 18, height _0  (1.702 m), weight 78.1 kg (172 lb 2.9 oz), SpO2 97 %.  Risk to Self: Is patient at risk for suicide?: Yes Risk to Others:   Prior Inpatient Therapy:   Prior Outpatient Therapy:    Current Facility-Administered Medications  Medication Dose Route Frequency Provider Last Rate Last Dose  . 0.9 %  sodium chloride infusion  250 mL Intravenous PRN Corey Harold, NP      . 0.9 %  sodium chloride infusion   Intravenous Continuous Laverle Hobby, MD 50 mL/hr at 06/27/15 0612    . acetaminophen (TYLENOL) tablet 650 mg  650 mg Oral Q6H PRN Chesley Mires, MD      . fentaNYL (SUBLIMAZE) injection 50 mcg  50 mcg Intravenous Q2H PRN Chesley Mires, MD      . heparin injection 5,000 Units  5,000 Units Subcutaneous 3 times per day Chesley Mires, MD   5,000 Units at 06/26/15 2227  . sodium bicarbonate 150 mEq in dextrose 5 % 1,000 mL infusion  Intravenous Continuous Chesley Mires, MD 50 mL/hr at 06/27/15 0612      Musculoskeletal: Strength & Muscle Tone: decreased Gait & Station: unable to stand Patient leans: N/A  Psychiatric Specialty Exam: Physical Exam as per history and physical  ROS   Blood  pressure 132/68, pulse 60, temperature 98.1 F (36.7 C), temperature source Oral, resp. rate 18, height _0  (1.702 m), weight 78.1 kg (172 lb 2.9 oz), SpO2 97 %.Body mass index is 26.96 kg/(m^2).  General Appearance: Guarded  Eye Contact::  Good  Speech:  Clear and Coherent  Volume:  Decreased  Mood:  Anxious, Depressed, Hopeless and Irritable  Affect:  Congruent and Depressed  Thought Process:  Coherent and Goal Directed  Orientation:  Full (Time, Place, and Person)  Thought Content:  WDL  Suicidal Thoughts:  Yes.  with intent/plan  Homicidal Thoughts:  No  Memory:  Immediate;   Fair Recent;   Fair Remote;   Fair  Judgement:  Impaired  Insight:  Fair  Psychomotor Activity:  Decreased  Concentration:  Fair  Recall:  Good  Fund of Knowledge:Good  Language: Good  Akathisia:  Negative  Handed:  Right  AIMS (if indicated):     Assets:  Communication Skills Desire for Improvement Financial Resources/Insurance Housing Leisure Time Resilience Social Support Talents/Skills Transportation  ADL's:  Impaired  Cognition: WNL  Sleep:      Medical Decision Making: New problem, with additional work up planned, Review of Psycho-Social Stressors (1), Review or order clinical lab tests (1), Review of Last Therapy Session (1), Review or order medicine tests (1), Review of Medication Regimen & Side Effects (2) and Review of New Medication or Change in Dosage (2)  Treatment Plan Summary: Patient meet criteria for capacity acute psychiatric hospitalization for crisis stabilization, safety monitoring and medication management for depression and substance abuse.  Daily contact with patient to assess and evaluate symptoms and progress in treatment and Medication management  Plan: Safety concern: Continue safety sitter Refer to psych social service for appropriate psych placement No psych medication recommend at this time Substance abuse: [place] psychiatric Inpatient admission  when medically cleared. Supportive therapy provided about ongoing stressors.  Disposition: Admit to acute psych admission when medically stable.  Kirklin Mcduffee,JANARDHAHA R. 06/27/2015 11:20 AM

## 2015-06-27 NOTE — Tx Team (Addendum)
Initial Interdisciplinary Treatment Plan   PATIENT STRESSORS: Financial difficulties Legal issue Marital or family conflict   PATIENT STRENGTHS: Ability for insight Communication skills Supportive family/friends   PROBLEM LIST: Problem List/Patient Goals Date to be addressed Date deferred Reason deferred Estimated date of resolution  Pt stated,'i was making $58,000 a year until my wife convinced me to go back to school and only make $17,000."" The legal bills are killing me.My wife's dad is paying her legal fees and I know she will try to make me pick up her lawyer bills."      Pt stated," I am not sure why my wife is doing this to me. She has painted me as the worst human being ever."      Pt admitted to being married times 12 years.             Depression 06/29/2015     SI 06/29/2015           Goal- I'm going to move back to [place] with family."             DISCHARGE CRITERIA:  Ability to meet basic life and health needs Motivation to continue treatment in a less acute level of care Safe-care adequate arrangements made  PRELIMINARY DISCHARGE PLAN: Attend aftercare/continuing care group Outpatient therapy Return to previous living arrangement  PATIENT/FAMIILY INVOLVEMENT: This treatment plan has been presented to and reviewed with the patient, [name],.  The patient and family have been given the opportunity to ask questions and make suggestions.  [facility] 06/27/2015, 6:35 PM

## 2015-06-28 ENCOUNTER — Encounter (HOSPITAL_COMMUNITY): Payer: Self-pay | Admitting: Psychiatry

## 2015-06-28 DIAGNOSIS — F1099 Alcohol use, unspecified with unspecified alcohol-induced disorder: Secondary | ICD-10-CM

## 2015-06-28 DIAGNOSIS — F1021 Alcohol dependence, in remission: Secondary | ICD-10-CM | POA: Diagnosis present

## 2015-06-28 DIAGNOSIS — F1210 Cannabis abuse, uncomplicated: Secondary | ICD-10-CM | POA: Diagnosis present

## 2015-06-28 DIAGNOSIS — F322 Major depressive disorder, single episode, severe without psychotic features: Secondary | ICD-10-CM | POA: Diagnosis present

## 2015-06-28 DIAGNOSIS — F1220 Cannabis dependence, uncomplicated: Secondary | ICD-10-CM | POA: Diagnosis present

## 2015-06-28 DIAGNOSIS — F1410 Cocaine abuse, uncomplicated: Secondary | ICD-10-CM | POA: Diagnosis present

## 2015-06-28 DIAGNOSIS — Z720 Tobacco use: Secondary | ICD-10-CM

## 2015-06-28 DIAGNOSIS — F17200 Nicotine dependence, unspecified, uncomplicated: Secondary | ICD-10-CM | POA: Diagnosis present

## 2015-06-28 DIAGNOSIS — R45851 Suicidal ideations: Secondary | ICD-10-CM

## 2015-06-28 MED ORDER — HYDROXYZINE HCL 25 MG PO TABS
25.0000 mg | ORAL_TABLET | Freq: Four times a day (QID) | ORAL | Status: DC | PRN
  Administered 2015-06-28 – 2015-07-02 (×9): 25 mg via ORAL
  Filled 2015-06-28 (×7): qty 1
  Filled 2015-06-28: qty 6
  Filled 2015-06-28 (×3): qty 1

## 2015-06-28 MED ORDER — CITALOPRAM HYDROBROMIDE 10 MG PO TABS
10.0000 mg | ORAL_TABLET | Freq: Every day | ORAL | Status: DC
  Administered 2015-06-28 – 2015-06-30 (×3): 10 mg via ORAL
  Filled 2015-06-28 (×6): qty 1

## 2015-06-28 NOTE — Plan of Care (Signed)
Problem: Ineffective individual coping Goal: STG: Patient will remain free from self harm Outcome: Progressing Pt remains safe on Q 15 minutes checks as ordered for safety. No self injurious behavior to report at this time.

## 2015-06-28 NOTE — Progress Notes (Signed)
Writer spoke with patient 1:1 and he reports that his day has been "partly cloudy with a chance of rain and possible thunderstorms." He inquired about his hs medications and was informed. He has been up in the dayroom briefly watching tv and interacting appropriately with peers. He denies si/hi/a/v hallucinations. He reports that he plans to go back to [place] with family once discharged. He talked briefly about his overdose and how he is fortunate to have come out of it without major damage to his body. He attended group this evening and participated. Support and encouragement given, safety maintained on unit with 15 min checks.

## 2015-06-28 NOTE — [facility] Counselor (Signed)
Adult Comprehensive Assessment  Patient ID: Leonard Martinez, male   DOB: [birth year], 44 y.o.   MRN: [id number]  Information Source: Information source: Patient  Current Stressors:  Educational / Learning stressors: Denies stressors - says wife made him (by enticing him with children) go to school to get 4-year degree to change careers.  Now is a year away from his bachelor's degree. Employment / Job issues: Quit a job that paid well because his wife said he worked too many hours.  Is not working now. Family Relationships: Feels his wife is holding his kids over his head.  Is very stressed by not being able to see them as much as he likes.  Says this led to his suicide attempt.   Gets only 2 hours 2 days a week, supervised by wife's parents.  Also gets 30 minutes per day to talk with each child.  She has him in court, and he is unable to get a lawyer without ability to pay.   Financial / Lack of resources (include bankruptcy): No income - no means to get back and forth for visitation.   Housing / Lack of housing: Lives with aunt in [place] Physical health (include injuries & life threatening diseases): Little worried about what he has done with his body with taking all the aspirin for a suicide attempt. 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   Bereavement / Loss: Loss of children to wife is really affecting him negatively.    Living/Environment/Situation:  Living Arrangements: Other relatives (Aunt and uncle) Living conditions (as described by patient or guardian): Is living in a nice house, has his own room, and has a room for the kids to come and stay. How long has patient lived in current situation?: 3 weeks What is atmosphere in current home: Supportive, Loving, Comfortable  Family History:  Marital status: Separated Separated, when?: May 15, 2015 What types of issues  is patient dealing with in the relationship?: His wife caught him doing cocaine, and this has resulted in her taking away the children, getting an order for him to only get limited visitation and phone calls with the children. Additional relationship information: Has been married 12 years.  She has gotten him to quit his good job with high pay to go back to school, and he resents this. Does patient have children?: Yes How many children?: 2 How is patient's relationship with their children?: 8yo and 10yo - loving relationship.  His wife says they fear him, and they deny this to him.  Childhood History:  By whom was/is the patient raised?: Mother/father and step-parent Additional childhood history information: No contact with biological father - he signed over parental rights to mother Description of patient's relationship with caregiver when they were a child: Good with both mother and stepfather - was an athlete - mother welcomed all his friends. Patient's description of current relationship with people who raised him/her: Both are deceased 11-Mar-2001 and 03-11-06 Does patient have siblings?: No Did patient suffer any verbal/emotional/physical/sexual abuse as a child?: No Did patient suffer from severe childhood neglect?: No Has patient ever been sexually abused/assaulted/raped as an adolescent or adult?: No Was the patient ever a victim of a crime or a disaster?: No Witnessed domestic violence?: No Has patient been effected by domestic violence as an adult?: No  Education:  Highest grade of school patient has completed: 3 years college  Currently a student?: No (Is awaiting state residency in [place]) Learning disability?: No  Employment/Work Situation:   Employment situation: Unemployed Patient's job has been impacted by current illness: Yes Describe how patient's job has been impacted: Has a job Copy - will miss it due to this hospitalization What is the longest time patient has a  held a job?: 14 years 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 Has patient ever been in the [employer]?: Yes (Describe in comment) Copy [phone]) Has patient ever served in Buyer, retail?: No  Financial Resources:   Surveyor, quantity resources: Support from parents / caregiver Does patient have a Lawyer or guardian?: No  Alcohol/Substance Abuse:   What has been your use of drugs/alcohol within the last 12 months?: Marijuana - "Habitual smoker" but is "cleaning that up" due to job constraints; Cocaine - 3 times in last 12 months; Alcohol - 1-2 drinks out with dinner, not regularly If attempted suicide, did drugs/alcohol play a role in this?: Yes (Used cocaine just prior to suicide attempt) Alcohol/Substance Abuse Treatment Hx: Attends AA/NA If yes, describe treatment: Attended AA 15 years ago, condition of wife's parents Has alcohol/substance abuse ever caused legal problems?: Yes  Social Support System:   Patient's Community Support System: Good Describe Community Support System: Aunt, uncle Type of faith/religion: Believes the Bible is a good Therapist, nutritional, considers himself a Emergency planning/management officer of [employer] How does patient's faith help to cope with current illness?: Seeking is a way of life for him  Leisure/Recreation:   Leisure and Hobbies: [employer], spending time with kids, reading, fishing, camping, exercise, TV  Strengths/Needs:   What things does the patient do well?: [employer], writing, being a father, providing, cooking In what areas does patient struggle / problems for patient: Not seeing children like he wants, not able to see them even on the provided schedule because is living in [place] - does not understand why  Discharge Plan:   Does patient have access to transportation?: Yes (Friend will be coming back to drive him back to [place]) Will patient be returning to same living situation after discharge?: Yes (Aunt & uncle's in [place], is looking into  halfway houses.  Before going to [place] is going to turn himself over to authorities in [place], has missed court dates) Currently receiving community mental health services: No If no, would patient like referral for services when discharged?: Yes (What county?) [facility] [place] - ) Does patient have financial barriers related to discharge medications?: Yes Patient description of barriers related to discharge medications: No current income or insurance.  Summary/Recommendations:   Summary and Recommendations (to be completed by the evaluator): Clarence is a 44yo male hospitalized after a suicide attempt with cocaine and aspirin. He did this after court, finding out he can only see his children 2 hours 2 days a week due to wife finding him using cocaine.  He will be living in [place] with his aunt, needs follow-up there.  The patient would benefit from safety monitoring, medication evaluation, psychoeducation, group therapy, and discharge planning to link with ongoing resources. The patient refused referral to [facility] for smoking cessation, will not be living in [place].  The Discharge Process and Patient Involvement form was reviewed with patient at the end of the Psychosocial Assessment, and the patient confirmed understanding and signed that document, which was placed in the paper chart. Suicide Prevention Education was reviewed thoroughly, and a brochure left with patient.  The patient signed consent for  SPE to be provided to friend Timoteo Ace [phone].  Sarina Ser. 06/28/2015

## 2015-06-28 NOTE — Progress Notes (Signed)
D: Patient complained of anxiety and pain. Accepted po prn of vistaril 50 mg for anxiety and Acetaminophen 650 mg for pain. Medication was effective. Denies SI, AH/VH at this time. No behavioral issues noted.  A: encouraged to continue with the treatment plan. Due medications given as ordered. Every 15 minutes check for safety maintained. Will continue to monitor patient. R: Patient is safe.

## 2015-06-28 NOTE — Progress Notes (Signed)
D: Pt visible in milieu for long intervals during shift. Pt attended groups. Presents with flat affect and irritable mood. Denies SI, HI, AVH at time of assessment. Pt was agitated after meeting with assigned NP stated that he did not hear what he anticipated in terms of his care or getting out of here as he was demanding for some other medications. Pt was verbally redirectable at the time. A: All medications administered as ordered including PRN Vistaril for c/o anxiety and PRN Tylenol for c/o pain. Emotional support and availability provided to pt. Pt encouraged to voice needs. Q 15 minutes checks maintained for safety as ordered without gestures of self injurious behavior to report thus far this shift. R: Pt receptive to care. Cooperative with unit routines and compliant with current treatment regimen. Remains safe on and off unit.

## 2015-06-28 NOTE — Progress Notes (Signed)
Adult Psychoeducational Group Note  Date:  06/28/2015 Time:  10:07 PM  Group Topic/Focus:  Wrap-Up Group:   The focus of this group is to help patients review their daily goal of treatment and discuss progress on daily workbooks.  Participation Level:  Active  Participation Quality:  Appropriate  Affect:  Appropriate  Cognitive:  Appropriate  Insight: Appropriate  Engagement in Group:  Engaged  Modes of Intervention:  Discussion  Additional Comments: The patient expressed that he rates his day a 7 out of 10.The patient also said that he attended all groups.  Octavio Manns 06/28/2015, 10:07 PM

## 2015-06-28 NOTE — [facility] Group Notes (Signed)
[facility] Group Notes:  (Clinical Social Work)  06/28/2015     1:15-2:15PM  Summary of Progress/Problems:   The main focus of today's process group was to learn how to use a decisional balance exercise to move forward in the Stages of Change, which were described and discussed.  Patients listed needs on the whiteboard and unhealthy coping techniques often used to fill needs.  Motivational Interviewing and the whiteboard were utilized to help patients explore in depth the perceived benefits and costs of unhealthy coping techniques, as well as the  benefits and costs of replacing that with a healthy coping skills.  A handout was distributed for patients to be able to do this exercise for themselves.   The patient expressed that their own unhealthy coping involves "being an extremist when accusations are made against me."  He said he will go to great lengths to prove people wrong or prove them right when challenged.  He also talked about drinking and drugging as unhealthy coping, although he insisted he does it seldom.  He stated that he has always been interested in healthy lifestyles, and eats healthy, cooks, and does martial arts.  Type of Therapy:  Group Therapy - Process   Participation Level:  Active  Participation Quality:  Appropriate, Attentive and Sharing  Affect:  Blunted, Depressed and Irritable  Cognitive:  Alert, Appropriate and Oriented  Insight:  Developing/Improving  Engagement in Therapy:  Engaged  Modes of Intervention:  Education, Motivational Interviewing  Ambrose Mantle, LCSW 06/28/2015, 4:31 PM

## 2015-06-28 NOTE — H&P (Signed)
Psychiatric Admission Assessment Adult  Patient Identification: Leonard Martinez MRN:  [id number] Date of Evaluation:  06/28/2015 Chief Complaint:  SUBSTANCE INDUCED MOOD DISORDER SUICIDE ATTEMPT Principal Diagnosis: MDD (major depressive disorder), single episode, severe , no psychosis Diagnosis:   Patient Active Problem List   Diagnosis Date Noted  . MDD (major depressive disorder), single episode, severe , no psychosis [F32.2] 06/28/2015  . Cocaine use disorder, mild, abuse [F14.10] 06/28/2015  . Tobacco use disorder [Z72.0] 06/28/2015  . Cannabis use disorder, severe, dependence [F12.20] 06/28/2015  . Alcohol use disorder, moderate, in sustained remission [F10.99] 06/28/2015  . Suicide attempt [T14.91] 06/27/2015  . Psychoactive substance-induced mood disorder [F19.94, F06.30] 06/26/2015  . Salicylate overdose [N56.213Y] 06/25/2015   History of Present Illness:: Going through a child custody case with my wife and I just can't compete with her fathers money; and I guess I am just going to have to accept some things and move on.  I am a pretty strong person and never thought killing myself.   Patient states that he took overdose after feeling overwhelmed and depressed and "ended up here."  Elements:  Location:  Worsening depression. Quality:  Suicide attempt. Severity:  several days. Duration:  Several. Associated Signs/Symptoms: Depression Symptoms:  depressed mood, feelings of worthlessness/guilt, hopelessness, suicidal thoughts with specific plan, suicidal attempt, anxiety, (Hypo) Manic Symptoms:  Impulsivity, Irritable Mood, Anxiety Symptoms:  Excessive Worry, Psychotic Symptoms:  Denies PTSD Symptoms: Denies Total Time spent with patient: 1 hour  Past Medical History:  Past Medical History  Diagnosis Date  . Hyperlipidemia   . Herniated disc   . Chronic back pain    History reviewed. No pertinent past surgical history. Family History:  Family History  Problem  Relation Age of Onset  . Bladder Cancer Mother   . Pancreatic cancer Father   No known psych family history  Social History:  History  Alcohol Use  . Yes     History  Drug Use No    Social History   Social History  . Marital Status: Married    Spouse Name: N/A  . Number of Children: N/A  . Years of Education: N/A   Social History Main Topics  . Smoking status: Current Every Day Smoker -- 10.00 packs/day    Types: Cigarettes  . Smokeless tobacco: None  . Alcohol Use: Yes  . Drug Use: No  . Sexual Activity: Yes   Other Topics Concern  . None   Social History Narrative   Additional Social History:   Musculoskeletal: Strength & Muscle Tone: within normal limits Gait & Station: normal Patient leans: N/A  Psychiatric Specialty Exam: Physical Exam  Constitutional: He is oriented to person, place, and time.  Neck: Normal range of motion.  Respiratory: Effort normal.  Musculoskeletal: Normal range of motion.  Neurological: He is alert and oriented to person, place, and time.    ROS  Blood pressure 132/66, pulse 64, temperature 97.9 F (36.6 C), temperature source Oral, resp. rate 16, height 5' 7"  (1.702 m), weight 74.844 kg (165 lb), SpO2 100 %.Body mass index is 25.84 kg/(m^2).  General Appearance: Casual  Eye Contact::  Good  Speech:  Normal Rate  Volume:  Normal  Mood:  Anxious and Depressed  Affect:  Congruent  Thought Process:  Coherent  Orientation:  Full (Time, Place, and Person)  Thought Content:  Rumination  Suicidal Thoughts:  Post suicide attempt  Homicidal Thoughts:  No  Memory:  Immediate;   Fair Recent;   Fair Remote;  Fair  Judgement:  Impaired  Insight:  Lacking  Psychomotor Activity:  Normal  Concentration:  Poor  Recall:  [employer] of Knowledge:Fair  Language: Fair  Akathisia:  No  Handed:  Right  AIMS (if indicated):     Assets:  Communication Skills Desire for Improvement  ADL's:  Intact  Cognition: WNL  Sleep:      Risk  to Self: Is patient at risk for suicide?: Yes What has been your use of drugs/alcohol within the last 12 months?: Marijuana - "Habitual smoker" but is "cleaning that up" due to job constraints; Cocaine - 3 times in last 12 months; Alcohol - 1-2 drinks out with dinner, not regularly Risk to Others:   Prior Inpatient Therapy:   Prior Outpatient Therapy:    Alcohol Screening: Patient refused Alcohol Screening Tool: Yes 1. How often do you have a drink containing alcohol?: Monthly or less 2. How many drinks containing alcohol do you have on a typical day when you are drinking?: 1 or 2 3. How often do you have six or more drinks on one occasion?: Never Preliminary Score: 0 9. Have you or someone else been injured as a result of your drinking?: No 10. Has a relative or friend or a doctor or another health worker been concerned about your drinking or suggested you cut down?: No Alcohol Use Disorder Identification Test Final Score (AUDIT): 1 Brief Intervention: AUDIT score less than 7 or less-screening does not suggest unhealthy drinking-brief intervention not indicated  Allergies:  No Known Allergies Lab Results:  Results for orders placed or performed during the hospital encounter of 06/25/15 (from the past 48 hour(s))  CBC     Status: Abnormal   Collection Time: 06/27/15  5:04 AM  Result Value Ref Range   WBC 7.0 4.0 - 10.5 K/uL   RBC 4.15 (L) 4.22 - 5.81 MIL/uL   Hemoglobin 13.1 13.0 - 17.0 g/dL   HCT 38.2 (L) 39.0 - 52.0 %   MCV 92.0 78.0 - 100.0 fL   MCH 31.6 26.0 - 34.0 pg   MCHC 34.3 30.0 - 36.0 g/dL   RDW 13.2 11.5 - 15.5 %   Platelets 206 150 - 400 K/uL  Comprehensive metabolic panel     Status: Abnormal   Collection Time: 06/27/15  5:04 AM  Result Value Ref Range   Sodium 140 135 - 145 mmol/L   Potassium 3.5 3.5 - 5.1 mmol/L   Chloride 105 101 - 111 mmol/L   CO2 29 22 - 32 mmol/L   Glucose, Bld 91 65 - 99 mg/dL   BUN <5 (L) 6 - 20 mg/dL   Creatinine, Ser 1.19 0.61 - 1.24  mg/dL   Calcium 8.4 (L) 8.9 - 10.3 mg/dL   Total Protein 5.0 (L) 6.5 - 8.1 g/dL   Albumin 2.9 (L) 3.5 - 5.0 g/dL   AST 18 15 - 41 U/L   ALT 18 17 - 63 U/L   Alkaline Phosphatase 66 38 - 126 U/L   Total Bilirubin 0.5 0.3 - 1.2 mg/dL   GFR calc non Af Amer >60 >60 mL/min   GFR calc Af Amer >60 >60 mL/min    Comment: (NOTE) The eGFR has been calculated using the CKD EPI equation. This calculation has not been validated in all clinical situations. eGFR's persistently <60 mL/min signify possible Chronic Kidney Disease.    Anion gap 6 5 - 15  Magnesium     Status: None   Collection Time: 06/27/15  5:04 AM  Result Value Ref Range   Magnesium 1.9 1.7 - 2.4 mg/dL  Phosphorus     Status: None   Collection Time: 06/27/15  5:04 AM  Result Value Ref Range   Phosphorus 3.5 2.5 - 4.6 mg/dL  Salicylate level     Status: None   Collection Time: 06/27/15  8:05 AM  Result Value Ref Range   Salicylate Lvl <8.3 2.8 - 30.0 mg/dL  Urinalysis, Routine w reflex microscopic (not at [facility])     Status: Abnormal   Collection Time: 06/27/15  8:34 AM  Result Value Ref Range   Color, Urine YELLOW YELLOW   APPearance CLOUDY (A) CLEAR   Specific Gravity, Urine 1.016 1.005 - 1.030   pH 8.0 5.0 - 8.0   Glucose, UA NEGATIVE NEGATIVE mg/dL   Hgb urine dipstick NEGATIVE NEGATIVE   Bilirubin Urine NEGATIVE NEGATIVE   Ketones, ur NEGATIVE NEGATIVE mg/dL   Protein, ur NEGATIVE NEGATIVE mg/dL   Urobilinogen, UA 1.0 0.0 - 1.0 mg/dL   Nitrite NEGATIVE NEGATIVE   Leukocytes, UA NEGATIVE NEGATIVE    Comment: MICROSCOPIC NOT DONE ON URINES WITH NEGATIVE PROTEIN, BLOOD, LEUKOCYTES, NITRITE, OR GLUCOSE <1000 mg/dL.   Current Medications: Current Facility-Administered Medications  Medication Dose Route Frequency Provider Last Rate Last Dose  . acetaminophen (TYLENOL) tablet 650 mg  650 mg Oral Q6H PRN Kerrie Buffalo, NP   650 mg at 06/28/15 0941  . alum & mag hydroxide-simeth (MAALOX/MYLANTA) 200-200-20 MG/5ML  suspension 30 mL  30 mL Oral Q4H PRN Kerrie Buffalo, NP      . citalopram (CELEXA) tablet 10 mg  10 mg Oral Daily Ursula Alert, MD   10 mg at 06/28/15 1215  . hydrOXYzine (ATARAX/VISTARIL) tablet 25 mg  25 mg Oral Q6H PRN Ursula Alert, MD      . magnesium hydroxide (MILK OF MAGNESIA) suspension 30 mL  30 mL Oral Daily PRN Kerrie Buffalo, NP      . traZODone (DESYREL) tablet 50 mg  50 mg Oral QHS PRN Kerrie Buffalo, NP   50 mg at 06/27/15 2211   PTA Medications: No prescriptions prior to admission    Previous Psychotropic Medications: No   Substance Abuse History in the last 12 months:  Yes.      Consequences of Substance Abuse: Legal Consequences:  Jail/Court Family Consequences:  Family discord  Results for orders placed or performed during the hospital encounter of 06/25/15 (from the past 39 hour(s))  Urine rapid drug screen (hosp performed) (Not at [facility])     Status: Abnormal   Collection Time: 06/25/15  6:34 PM  Result Value Ref Range   Opiates NONE DETECTED NONE DETECTED   Cocaine POSITIVE (A) NONE DETECTED   Benzodiazepines NONE DETECTED NONE DETECTED   Amphetamines NONE DETECTED NONE DETECTED   Tetrahydrocannabinol POSITIVE (A) NONE DETECTED   Barbiturates NONE DETECTED NONE DETECTED    Comment:        DRUG SCREEN FOR MEDICAL PURPOSES ONLY.  IF CONFIRMATION IS NEEDED FOR ANY PURPOSE, NOTIFY LAB WITHIN 5 DAYS.        LOWEST DETECTABLE LIMITS FOR URINE DRUG SCREEN Drug Class       Cutoff (ng/mL) Amphetamine      1000 Barbiturate      200 Benzodiazepine   151 Tricyclics       761 Opiates          300 Cocaine          300 THC  50   Urinalysis, Routine w reflex microscopic (not at [facility])     Status: None   Collection Time: 06/25/15  6:34 PM  Result Value Ref Range   Color, Urine YELLOW YELLOW   APPearance CLEAR CLEAR   Specific Gravity, Urine 1.009 1.005 - 1.030   pH 6.0 5.0 - 8.0   Glucose, UA NEGATIVE NEGATIVE mg/dL   Hgb urine dipstick  NEGATIVE NEGATIVE   Bilirubin Urine NEGATIVE NEGATIVE   Ketones, ur NEGATIVE NEGATIVE mg/dL   Protein, ur NEGATIVE NEGATIVE mg/dL   Urobilinogen, UA 0.2 0.0 - 1.0 mg/dL   Nitrite NEGATIVE NEGATIVE   Leukocytes, UA NEGATIVE NEGATIVE    Comment: MICROSCOPIC NOT DONE ON URINES WITH NEGATIVE PROTEIN, BLOOD, LEUKOCYTES, NITRITE, OR GLUCOSE <1000 mg/dL.  MRSA PCR Screening     Status: None   Collection Time: 06/25/15  6:55 PM  Result Value Ref Range   MRSA by PCR NEGATIVE NEGATIVE    Comment:        The GeneXpert MRSA Assay (FDA approved for NASAL specimens only), is one component of a comprehensive MRSA colonization surveillance program. It is not intended to diagnose MRSA infection nor to guide or monitor treatment for MRSA infections.   Glucose, capillary     Status: None   Collection Time: 06/25/15  6:57 PM  Result Value Ref Range   Glucose-Capillary 85 65 - 99 mg/dL  Blood gas, arterial     Status: Abnormal   Collection Time: 06/25/15  7:55 PM  Result Value Ref Range   pH, Arterial 7.488 (H) 7.350 - 7.450   pCO2 arterial 31.8 (L) 35.0 - 45.0 mmHg   pO2, Arterial 90.7 80.0 - 100.0 mmHg   Bicarbonate 23.9 20.0 - 24.0 mEq/L   TCO2 24.8 0 - 100 mmol/L   Acid-Base Excess 0.8 0.0 - 2.0 mmol/L   O2 Saturation 97.3 %   Patient temperature 98.6    Collection site RIGHT RADIAL    Drawn by [id number]    Sample type ARTERIAL DRAW    Allens test (pass/fail) PASS PASS  Salicylate level     Status: Abnormal   Collection Time: 06/25/15  8:02 PM  Result Value Ref Range   Salicylate Lvl 60.1 (HH) 2.8 - 30.0 mg/dL    Comment: CRITICAL RESULT CALLED TO, READ BACK BY AND VERIFIED WITH: C SMITH,RN [id number] 06/25/15 D BRADLEY   Basic metabolic panel     Status: Abnormal   Collection Time: 06/25/15  8:02 PM  Result Value Ref Range   Sodium 144 135 - 145 mmol/L   Potassium 3.6 3.5 - 5.1 mmol/L   Chloride 107 101 - 111 mmol/L   CO2 26 22 - 32 mmol/L   Glucose, Bld 108 (H) 65 - 99 mg/dL   BUN 6 6  - 20 mg/dL   Creatinine, Ser 1.28 (H) 0.61 - 1.24 mg/dL   Calcium 8.7 (L) 8.9 - 10.3 mg/dL   GFR calc non Af Amer >60 >60 mL/min   GFR calc Af Amer >60 >60 mL/min    Comment: (NOTE) The eGFR has been calculated using the CKD EPI equation. This calculation has not been validated in all clinical situations. eGFR's persistently <60 mL/min signify possible Chronic Kidney Disease.    Anion gap 11 5 - 15  Magnesium     Status: None   Collection Time: 06/25/15  8:02 PM  Result Value Ref Range   Magnesium 2.1 1.7 - 2.4 mg/dL  Phosphorus     Status: None  Collection Time: 06/25/15  8:02 PM  Result Value Ref Range   Phosphorus 3.6 2.5 - 4.6 mg/dL  Lactic acid, plasma     Status: None   Collection Time: 06/25/15  8:02 PM  Result Value Ref Range   Lactic Acid, Venous 1.5 0.5 - 2.0 mmol/L  CBC     Status: None   Collection Time: 06/25/15  8:02 PM  Result Value Ref Range   WBC 9.1 4.0 - 10.5 K/uL   RBC 4.33 4.22 - 5.81 MIL/uL   Hemoglobin 13.6 13.0 - 17.0 g/dL   HCT 40.1 39.0 - 52.0 %   MCV 92.6 78.0 - 100.0 fL   MCH 31.4 26.0 - 34.0 pg   MCHC 33.9 30.0 - 36.0 g/dL   RDW 13.3 11.5 - 15.5 %   Platelets 227 150 - 400 K/uL  Urinalysis, Routine w reflex microscopic (not at [facility])     Status: None   Collection Time: 06/25/15  9:34 PM  Result Value Ref Range   Color, Urine YELLOW YELLOW   APPearance CLEAR CLEAR   Specific Gravity, Urine 1.007 1.005 - 1.030   pH 6.0 5.0 - 8.0   Glucose, UA NEGATIVE NEGATIVE mg/dL   Hgb urine dipstick NEGATIVE NEGATIVE   Bilirubin Urine NEGATIVE NEGATIVE   Ketones, ur NEGATIVE NEGATIVE mg/dL   Protein, ur NEGATIVE NEGATIVE mg/dL   Urobilinogen, UA 0.2 0.0 - 1.0 mg/dL   Nitrite NEGATIVE NEGATIVE   Leukocytes, UA NEGATIVE NEGATIVE    Comment: MICROSCOPIC NOT DONE ON URINES WITH NEGATIVE PROTEIN, BLOOD, LEUKOCYTES, NITRITE, OR GLUCOSE <1000 mg/dL.  Basic metabolic panel     Status: Abnormal   Collection Time: 06/25/15 11:55 PM  Result Value Ref Range    Sodium 146 (H) 135 - 145 mmol/L   Potassium 3.7 3.5 - 5.1 mmol/L   Chloride 111 101 - 111 mmol/L   CO2 26 22 - 32 mmol/L   Glucose, Bld 92 65 - 99 mg/dL   BUN 6 6 - 20 mg/dL   Creatinine, Ser 1.23 0.61 - 1.24 mg/dL   Calcium 8.9 8.9 - 10.3 mg/dL   GFR calc non Af Amer >60 >60 mL/min   GFR calc Af Amer >60 >60 mL/min    Comment: (NOTE) The eGFR has been calculated using the CKD EPI equation. This calculation has not been validated in all clinical situations. eGFR's persistently <60 mL/min signify possible Chronic Kidney Disease.    Anion gap 9 5 - 15  Glucose, capillary     Status: None   Collection Time: 06/26/15 12:05 AM  Result Value Ref Range   Glucose-Capillary 95 65 - 99 mg/dL  Salicylate level     Status: Abnormal   Collection Time: 06/26/15 12:14 AM  Result Value Ref Range   Salicylate Lvl 22.2 (HH) 2.8 - 30.0 mg/dL    Comment: CRITICAL RESULT CALLED TO, READ BACK BY AND VERIFIED WITH: HANCOCK M,RN 06/26/15 0115 WAYK   Basic metabolic panel     Status: Abnormal   Collection Time: 06/26/15  2:59 AM  Result Value Ref Range   Sodium 143 135 - 145 mmol/L   Potassium 4.1 3.5 - 5.1 mmol/L   Chloride 109 101 - 111 mmol/L   CO2 24 22 - 32 mmol/L   Glucose, Bld 106 (H) 65 - 99 mg/dL   BUN 6 6 - 20 mg/dL   Creatinine, Ser 1.30 (H) 0.61 - 1.24 mg/dL   Calcium 8.5 (L) 8.9 - 10.3 mg/dL   GFR  calc non Af Amer >60 >60 mL/min   GFR calc Af Amer >60 >60 mL/min    Comment: (NOTE) The eGFR has been calculated using the CKD EPI equation. This calculation has not been validated in all clinical situations. eGFR's persistently <60 mL/min signify possible Chronic Kidney Disease.    Anion gap 10 5 - 15  Salicylate level     Status: None   Collection Time: 06/26/15  2:59 AM  Result Value Ref Range   Salicylate Lvl 65.4 2.8 - 30.0 mg/dL  Glucose, capillary     Status: Abnormal   Collection Time: 06/26/15  4:10 AM  Result Value Ref Range   Glucose-Capillary 102 (H) 65 - 99 mg/dL    Comment 1 Notify RN    Comment 2 Document in Chart   Urinalysis, Routine w reflex microscopic (not at [facility])     Status: None   Collection Time: 06/26/15  5:30 AM  Result Value Ref Range   Color, Urine YELLOW YELLOW   APPearance CLEAR CLEAR   Specific Gravity, Urine 1.009 1.005 - 1.030   pH 7.0 5.0 - 8.0   Glucose, UA NEGATIVE NEGATIVE mg/dL   Hgb urine dipstick NEGATIVE NEGATIVE   Bilirubin Urine NEGATIVE NEGATIVE   Ketones, ur NEGATIVE NEGATIVE mg/dL   Protein, ur NEGATIVE NEGATIVE mg/dL   Urobilinogen, UA 0.2 0.0 - 1.0 mg/dL   Nitrite NEGATIVE NEGATIVE   Leukocytes, UA NEGATIVE NEGATIVE    Comment: MICROSCOPIC NOT DONE ON URINES WITH NEGATIVE PROTEIN, BLOOD, LEUKOCYTES, NITRITE, OR GLUCOSE <1000 mg/dL.  Glucose, capillary     Status: None   Collection Time: 06/26/15  7:52 AM  Result Value Ref Range   Glucose-Capillary 94 65 - 99 mg/dL  Glucose, capillary     Status: Abnormal   Collection Time: 06/26/15  9:04 AM  Result Value Ref Range   Glucose-Capillary 137 (H) 65 - 99 mg/dL   Comment 1 Notify RN   Glucose, capillary     Status: None   Collection Time: 06/26/15 11:23 AM  Result Value Ref Range   Glucose-Capillary 78 65 - 99 mg/dL  Glucose, capillary     Status: Abnormal   Collection Time: 06/26/15  3:42 PM  Result Value Ref Range   Glucose-Capillary 107 (H) 65 - 99 mg/dL  CBC     Status: Abnormal   Collection Time: 06/27/15  5:04 AM  Result Value Ref Range   WBC 7.0 4.0 - 10.5 K/uL   RBC 4.15 (L) 4.22 - 5.81 MIL/uL   Hemoglobin 13.1 13.0 - 17.0 g/dL   HCT 38.2 (L) 39.0 - 52.0 %   MCV 92.0 78.0 - 100.0 fL   MCH 31.6 26.0 - 34.0 pg   MCHC 34.3 30.0 - 36.0 g/dL   RDW 13.2 11.5 - 15.5 %   Platelets 206 150 - 400 K/uL  Comprehensive metabolic panel     Status: Abnormal   Collection Time: 06/27/15  5:04 AM  Result Value Ref Range   Sodium 140 135 - 145 mmol/L   Potassium 3.5 3.5 - 5.1 mmol/L   Chloride 105 101 - 111 mmol/L   CO2 29 22 - 32 mmol/L   Glucose, Bld 91  65 - 99 mg/dL   BUN <5 (L) 6 - 20 mg/dL   Creatinine, Ser 1.19 0.61 - 1.24 mg/dL   Calcium 8.4 (L) 8.9 - 10.3 mg/dL   Total Protein 5.0 (L) 6.5 - 8.1 g/dL   Albumin 2.9 (L) 3.5 - 5.0 g/dL  AST 18 15 - 41 U/L   ALT 18 17 - 63 U/L   Alkaline Phosphatase 66 38 - 126 U/L   Total Bilirubin 0.5 0.3 - 1.2 mg/dL   GFR calc non Af Amer >60 >60 mL/min   GFR calc Af Amer >60 >60 mL/min    Comment: (NOTE) The eGFR has been calculated using the CKD EPI equation. This calculation has not been validated in all clinical situations. eGFR's persistently <60 mL/min signify possible Chronic Kidney Disease.    Anion gap 6 5 - 15  Magnesium     Status: None   Collection Time: 06/27/15  5:04 AM  Result Value Ref Range   Magnesium 1.9 1.7 - 2.4 mg/dL  Phosphorus     Status: None   Collection Time: 06/27/15  5:04 AM  Result Value Ref Range   Phosphorus 3.5 2.5 - 4.6 mg/dL  Salicylate level     Status: None   Collection Time: 06/27/15  8:05 AM  Result Value Ref Range   Salicylate Lvl <8.1 2.8 - 30.0 mg/dL  Urinalysis, Routine w reflex microscopic (not at [facility])     Status: Abnormal   Collection Time: 06/27/15  8:34 AM  Result Value Ref Range   Color, Urine YELLOW YELLOW   APPearance CLOUDY (A) CLEAR   Specific Gravity, Urine 1.016 1.005 - 1.030   pH 8.0 5.0 - 8.0   Glucose, UA NEGATIVE NEGATIVE mg/dL   Hgb urine dipstick NEGATIVE NEGATIVE   Bilirubin Urine NEGATIVE NEGATIVE   Ketones, ur NEGATIVE NEGATIVE mg/dL   Protein, ur NEGATIVE NEGATIVE mg/dL   Urobilinogen, UA 1.0 0.0 - 1.0 mg/dL   Nitrite NEGATIVE NEGATIVE   Leukocytes, UA NEGATIVE NEGATIVE    Comment: MICROSCOPIC NOT DONE ON URINES WITH NEGATIVE PROTEIN, BLOOD, LEUKOCYTES, NITRITE, OR GLUCOSE <1000 mg/dL.    Observation Level/Precautions:  15 minute checks  Laboratory:  CBC Chemistry Profile UDS UA  Psychotherapy:  Individual and group session  Medications:  Medications will be started as appropriate for patient stabilization   Consultations:  Psychiatry  Discharge Concerns:  Safety, stabilization, and risk of access to medication and medication stabilization   Estimated LOS:  5-7 days  Other:     Psychological Evaluations: Yes   Treatment Plan Summary: Daily contact with patient to assess and evaluate symptoms and progress in treatment and Medication management  PLAN OF CARE:  Patient will benefit from inpatient treatment and stabilization.  Estimated length of stay is 5-7 days.  Reviewed past medical records,treatment plan.  Patient presents after S/P suicide attempt by OD on aspirin. Pt is going through several stressors in his life like divorce , losing custody of children , has no job , having to relocate to [place] and so on. Will start Celexa 10 mg po daily for affective sx. Will continue Trazodone 50 mg po qhs for sleep. Will make available Vistaril 25 mg po prn for anxiety sx. Restart home medications where needed. Will continue to monitor vitals ,medication compliance and treatment side effects while patient is here.  Will monitor for medical issues as well as call consult as needed.  Reviewed labs ,CBC, CMP - wnl- UDS pos- cocaine , cannabis - will get TSH.  CSW will start working on disposition.  Patient to participate in therapeutic milieu .    Medical Decision Making:  Review of Psycho-Social Stressors (1), Review or order clinical lab tests (1), Review and summation of old records (2), Review of Last Therapy Session (1) and Review  of Medication Regimen & Side Effects (2)  I certify that inpatient services furnished can reasonably be expected to improve the patient's condition.   Rankin, Shuvon, FNP-BC 8/20/20166:30 PM

## 2015-06-28 NOTE — [facility] Suicide Risk Assessment (Signed)
[facility] Admission Suicide Risk Assessment   Nursing information obtained from:  Patient Demographic factors:  Male, Caucasian Current Mental Status:  Self-harm thoughts Loss Factors:  Loss of significant relationship, Legal issues, Financial problems / change in socioeconomic status Historical Factors:    Risk Reduction Factors:  Sense of responsibility to family, Religious beliefs about death, Living with another person, especially a relative, Positive social support Total Time spent with patient: 30 minutes Principal Problem: MDD (major depressive disorder), single episode, severe , no psychosis Diagnosis:   Patient Active Problem List   Diagnosis Date Noted  . MDD (major depressive disorder), single episode, severe , no psychosis [F32.2] 06/28/2015  . Cocaine use disorder, mild, abuse [F14.10] 06/28/2015  . Tobacco use disorder [Z72.0] 06/28/2015  . Cannabis use disorder, severe, dependence [F12.20] 06/28/2015  . Alcohol use disorder, moderate, in sustained remission [F10.99] 06/28/2015  . Suicide attempt [T14.91] 06/27/2015  . Psychoactive substance-induced mood disorder [F19.94, F06.30] 06/26/2015  . Salicylate overdose [T39.091A] 06/25/2015     Continued Clinical Symptoms:  Alcohol Use Disorder Identification Test Final Score (AUDIT): 1 The "Alcohol Use Disorders Identification Test", Guidelines for Use in Primary Care, Second Edition.  World Science writer [facility]). Score between 0-7:  no or low risk or alcohol related problems. Score between 8-15:  moderate risk of alcohol related problems. Score between 16-19:  high risk of alcohol related problems. Score 20 or above:  warrants further diagnostic evaluation for alcohol dependence and treatment.   CLINICAL FACTORS:   Depression:   Comorbid alcohol abuse/dependence Hopelessness Impulsivity Medical Diagnoses and Treatments/Surgeries   Musculoskeletal: Strength & Muscle Tone: within normal limits Gait & Station:  normal Patient leans: N/A  Psychiatric Specialty Exam: Physical Exam  Review of Systems  Musculoskeletal: Positive for back pain.  Psychiatric/Behavioral: Positive for depression and substance abuse.  All other systems reviewed and are negative.   Blood pressure 132/66, pulse 64, temperature 97.9 F (36.6 C), temperature source Oral, resp. rate 16, height 5\' 7"  (1.702 m), weight 74.844 kg (165 lb), SpO2 100 %.Body mass index is 25.84 kg/(m^2).  General Appearance: Casual  Eye Contact::  Good  Speech:  Normal Rate  Volume:  Normal  Mood:  Anxious and Irritable  Affect:  Congruent  Thought Process:  Coherent  Orientation:  Full (Time, Place, and Person)  Thought Content:  Rumination  Suicidal Thoughts:  No S/P SUICIDE ATTEMPT  Homicidal Thoughts:  No  Memory:  Immediate;   Fair Recent;   Fair Remote;   Fair  Judgement:  Impaired  Insight:  Lacking  Psychomotor Activity:  Normal  Concentration:  Poor  Recall:  [employer] of Knowledge:Fair  Language: Fair  Akathisia:  No  Handed:  Right  AIMS (if indicated):     Assets:  Communication Skills Desire for Improvement  Sleep:     Cognition: WNL  ADL's:  Intact     COGNITIVE FEATURES THAT CONTRIBUTE TO RISK:  Closed-mindedness, Polarized thinking and Thought constriction (tunnel vision)    SUICIDE RISK:   Moderate:  Frequent suicidal ideation with limited intensity, and duration, some specificity in terms of plans, no associated intent, good self-control, limited dysphoria/symptomatology, some risk factors present, and identifiable protective factors, including available and accessible social support.  PLAN OF CARE: Patient will benefit from inpatient treatment and stabilization.  Estimated length of stay is 5-7 days.  Reviewed past medical records,treatment plan.  Patient presents after S/P suicide attempt by OD on aspirin. Pt is going through several stressors in  his life like divorce , losing custody of children , has  no job , having to relocate to [facility] and so on. Will start Celexa 10 mg po daily for affective sx. Will continue Trazodone 50 mg po qhs for sleep. Will make available Vistaril 25 mg po prn for anxiety sx. Restart home medications where needed. Will continue to monitor vitals ,medication compliance and treatment side effects while patient is here.  Will monitor for medical issues as well as call consult as needed.  Reviewed labs ,CBC, CMP - wnl- UDS pos- cocaine , cannabis - will get TSH.  CSW will start working on disposition.  Patient to participate in therapeutic milieu .       Medical Decision Making:  Review of Psycho-Social Stressors (1), Review or order clinical lab tests (1), Review and summation of old records (2), Established Problem, Worsening (2), Review of Last Therapy Session (1), Review of Medication Regimen & Side Effects (2) and Review of New Medication or Change in Dosage (2)  I certify that inpatient services furnished can reasonably be expected to improve the patient's condition.   Saleena Tamas MD  06/28/2015, 11:24 AM

## 2015-06-28 NOTE — [facility] Group Notes (Signed)
[facility] Group Notes:  (Nursing/Healthy Coping Skills)  Date:  06/28/2015  Time:  0930 Type of Therapy:  Nurse Education  Participation Level:  Active  Participation Quality:  Appropriate and Attentive  Affect:  Anxious  Cognitive:  Alert, Appropriate and Oriented  Insight:  Good  Engagement in Group:  Engaged  Modes of Intervention:  Discussion, Education, Orientation and Support  Summary of Progress/Problems: Pt attended scheduled groups and was engaged.   Ouida Sills, Lincoln Maxin 06/28/2015, 0930

## 2015-06-29 LAB — TSH: TSH: 1.837 u[IU]/mL (ref 0.350–4.500)

## 2015-06-29 MED ORDER — NICOTINE 14 MG/24HR TD PT24
14.0000 mg | MEDICATED_PATCH | Freq: Every day | TRANSDERMAL | Status: DC
  Filled 2015-06-29 (×7): qty 1

## 2015-06-29 NOTE — Progress Notes (Signed)
[facility] MD Progress Note  06/29/2015 5:06 PM Leonard Martinez  MRN:  [id number]   Subjective:  Patient states "I feel pretty good.  Sleep could be better."  Patient states that he is feeling better and that he is ready to leave.  "I am ready to get back to [place].  I am missing a job interview tomorrow cause I'm in here and there is no way I could make it there anyway now.."  Patient rates anxiety 3/10 and depression 1-2/10.  Objective::  Patient seen, chart reviewed, and discussed with staff.  Patient states that he is tolerating medication without adverse effect; attending and participating in group sessions.  Patient is wanting social work to help find/assist with outpatient services in [place].  Patient states that he has already spoken to his aunt and he will be staying with her if he is unable to get into a half way house.  States that he wants to get his life together so that he can work on getting visitation rights to his kids.    Principal Problem: MDD (major depressive disorder), single episode, severe , no psychosis Diagnosis:   Patient Active Problem List   Diagnosis Date Noted  . MDD (major depressive disorder), single episode, severe , no psychosis [F32.2] 06/28/2015  . Cocaine use disorder, mild, abuse [F14.10] 06/28/2015  . Tobacco use disorder [Z72.0] 06/28/2015  . Cannabis use disorder, severe, dependence [F12.20] 06/28/2015  . Alcohol use disorder, moderate, in sustained remission [F10.99] 06/28/2015  . Suicide attempt [T14.91] 06/27/2015  . Psychoactive substance-induced mood disorder [F19.94, F06.30] 06/26/2015  . Salicylate overdose [T39.091A] 06/25/2015   Total Time spent with patient: 45 minutes   Past Medical History:  Past Medical History  Diagnosis Date  . Hyperlipidemia   . Herniated disc   . Chronic back pain    History reviewed. No pertinent past surgical history. Family History:  Family History  Problem Relation Age of Onset  . Bladder Cancer Mother   .  Pancreatic cancer Father    Social History:  History  Alcohol Use  . Yes     History  Drug Use No    Social History   Social History  . Marital Status: Married    Spouse Name: N/A  . Number of Children: N/A  . Years of Education: N/A   Social History Main Topics  . Smoking status: Current Every Day Smoker -- 10.00 packs/day    Types: Cigarettes  . Smokeless tobacco: None  . Alcohol Use: Yes  . Drug Use: No  . Sexual Activity: Yes   Other Topics Concern  . None   Social History Narrative   Additional History:    Sleep: Fair  Appetite:  Good   Assessment:   Musculoskeletal: Strength & Muscle Tone: within normal limits Gait & Station: normal Patient leans: N/A   Psychiatric Specialty Exam: Physical Exam  Nursing note and vitals reviewed. Constitutional: He is oriented to person, place, and time.  Neck: Normal range of motion.  Respiratory: Effort normal.  Musculoskeletal: Normal range of motion.  Neurological: He is alert and oriented to person, place, and time.  Psychiatric: His speech is normal and behavior is normal. Thought content normal. Cognition and memory are normal. He exhibits a depressed mood.    Review of Systems  Psychiatric/Behavioral: Positive for depression and substance abuse. Negative for hallucinations. Suicidal ideas: Denies. The patient is nervous/anxious. Insomnia: Improving.   All other systems reviewed and are negative.   Blood pressure 132/86, pulse  66, temperature 97.2 F (36.2 C), temperature source Oral, resp. rate 20, height 5\' 7"  (1.702 m), weight 74.844 kg (165 lb), SpO2 100 %.Body mass index is 25.84 kg/(m^2).  General Appearance: Casual and Fairly Groomed  Eye Contact::  Good  Speech:  Clear and Coherent and Normal Rate  Volume:  Normal  Mood:  Depressed  Affect:  Depressed  Thought Process:  Circumstantial and Goal Directed  Orientation:  Full (Time, Place, and Person)  Thought Content:  Denies hallucinations,  delusions, and paranoia  Suicidal Thoughts:  No Denies at this time  Homicidal Thoughts:  No  Memory:  Immediate;   Good Recent;   Good Remote;   Good  Judgement:  Fair  Insight:  Fair and Present  Psychomotor Activity:  Normal  Concentration:  Fair  Recall:  Good  Fund of Knowledge:Fair  Language: Good  Akathisia:  No  Handed:  Right  AIMS (if indicated):     Assets:  Communication Skills Desire for Improvement Physical Health  ADL's:  Intact  Cognition: WNL  Sleep:  Number of Hours: 6.25     Current Medications: Current Facility-Administered Medications  Medication Dose Route Frequency Provider Last Rate Last Dose  . acetaminophen (TYLENOL) tablet 650 mg  650 mg Oral Q6H PRN Adonis Brook, NP   650 mg at 06/28/15 0941  . alum & mag hydroxide-simeth (MAALOX/MYLANTA) 200-200-20 MG/5ML suspension 30 mL  30 mL Oral Q4H PRN Adonis Brook, NP      . citalopram (CELEXA) tablet 10 mg  10 mg Oral Daily Saramma Eappen, MD   10 mg at 06/29/15 0815  . hydrOXYzine (ATARAX/VISTARIL) tablet 25 mg  25 mg Oral Q6H PRN Jomarie Longs, MD   25 mg at 06/28/15 2117  . magnesium hydroxide (MILK OF MAGNESIA) suspension 30 mL  30 mL Oral Daily PRN Adonis Brook, NP      . nicotine (NICODERM CQ - dosed in mg/24 hours) patch 14 mg  14 mg Transdermal Daily Craige Cotta, MD   14 mg at 06/29/15 0816  . traZODone (DESYREL) tablet 50 mg  50 mg Oral QHS PRN Adonis Brook, NP   50 mg at 06/28/15 2117    Lab Results:  Results for orders placed or performed during the hospital encounter of 06/27/15 (from the past 48 hour(s))  TSH     Status: None   Collection Time: 06/29/15  6:51 AM  Result Value Ref Range   TSH 1.837 0.350 - 4.500 uIU/mL    Comment: Performed at [facility]    Physical Findings: AIMS: Facial and Oral Movements Muscles of Facial Expression: None, normal Lips and Perioral Area: None, normal Jaw: None, normal Tongue: None, normal,Extremity Movements Upper  (arms, wrists, hands, fingers): None, normal Lower (legs, knees, ankles, toes): None, normal, Trunk Movements Neck, shoulders, hips: None, normal, Overall Severity Severity of abnormal movements (highest score from questions above): None, normal Incapacitation due to abnormal movements: None, normal Patient's awareness of abnormal movements (rate only patient's report): No Awareness, Dental Status Current problems with teeth and/or dentures?: No Does patient usually wear dentures?: No  CIWA:    COWS:     Treatment Plan Summary: Daily contact with patient to assess and evaluate symptoms and progress in treatment and Medication management   PLAN OF CARE:  Patient will benefit from inpatient treatment and stabilization.  Estimated length of stay is 5-7 days.  Reviewed past medical records,treatment plan.  Patient presents after S/P suicide attempt by OD on  aspirin. Pt is going through several stressors in his life like divorce , losing custody of children , has no job , having to relocate to [facility] and so on. Will start Celexa 10 mg po daily for affective sx. Will continue Trazodone 50 mg po qhs for sleep. Will make available Vistaril 25 mg po prn for anxiety sx. Restart home medications where needed. Will continue to monitor vitals ,medication compliance and treatment side effects while patient is here.  Will monitor for medical issues as well as call consult as needed.  Reviewed labs ,CBC, CMP - wnl- UDS pos- cocaine , cannabis - will get TSH.  CSW will start working on disposition.  Patient to participate in therapeutic milieu .   Will continue with current treatment plan; no changes at this time  Medical Decision Making:  Established Problem, Stable/Improving (1), Review of Psycho-Social Stressors (1), Review of Last Therapy Session (1) and Review of Medication Regimen & Side Effects (2)   Rankin, Shuvon, FNP-BC 06/29/2015, 5:06 PM

## 2015-06-29 NOTE — Progress Notes (Signed)
D)Affect slightly angry, but pt. Is pleasant on approach.  Pt. Reports frustration and some anger toward wife for issues over the children's custody. Pt. Reports his wife is trying to take the children away from him, and he reports he is  to "not going to let that happen".  A)Emotional support offered.  R) pt. Receptive and continues to remain on q . Observations.

## 2015-06-29 NOTE — Plan of Care (Signed)
Problem: Diagnosis: Increased Risk For Suicide Attempt Goal: LTG-Patient Will Report Improved Mood and Deny Suicidal LTG (by discharge) Patient will report improved mood and deny suicidal ideation.  Outcome: Progressing Patient currently denies suicidal ideations.     

## 2015-06-29 NOTE — Progress Notes (Signed)
Tonight Leonard Martinez said that his day was a 8 or 9 he was glad to be here.

## 2015-06-29 NOTE — [facility] Group Notes (Signed)
[facility] Group Notes:  (Clinical Social Work)  06/29/2015  1:15-2:15PM  Summary of Progress/Problems:   The main focus of today's process group was to   1)  discuss the importance of adding supports  2)  define health supports versus unhealthy supports  3)  identify the patient's current unhealthy supports and plan how to handle them  4)  Identify the patient's current healthy supports and plan what to add.  An emphasis was placed on using counselor, doctor, therapy groups, 12-step groups, and problem-specific support groups to expand supports.    The patient expressed full comprehension of the concepts presented, and agreed that there is a need to add more supports.  The patient stated his aunt, uncles and many cousins along with good friends are healthy supports.  He plans to return to AA, and talked at length about how that has helped so much in the past, could help again.  Type of Therapy:  Process Group with Motivational Interviewing  Participation Level:  Active  Participation Quality:  Appropriate, Attentive, Sharing and Supportive  Affect:  Blunted  Cognitive:  Alert and Appropriate  Insight:  Engaged  Engagement in Therapy:  Engaged  Modes of Intervention:   Education, Support and Processing, Activity  Ambrose Mantle, LCSW 06/29/2015

## 2015-06-29 NOTE — Progress Notes (Signed)
Adult Psychoeducational Group Note  Date:  06/29/2015 Time:  3:12 PM  Group Topic/Focus:  Coping With Mental Health Crisis:   The purpose of this group is to help patients identify strategies for coping with mental health crisis.  Group discusses possible causes of crisis and ways to manage them effectively.  Participation Level:  Active  Participation Quality:  Appropriate, Attentive and Sharing  Affect:  Angry and Depressed  Cognitive:  Alert, Appropriate and Oriented  Insight: Improving  Engagement in Group:  Developing/Improving  Modes of Intervention:  Discussion, Education and Socialization  Additional Comments:  Pt. Discussed issues with trusting others, and spoke to his frustration with wife for wanting to keep their children from him.  Support offered.    Leonard Martinez 06/29/2015, 3:12 PM

## 2015-06-30 DIAGNOSIS — F322 Major depressive disorder, single episode, severe without psychotic features: Principal | ICD-10-CM

## 2015-06-30 MED ORDER — IBUPROFEN 600 MG PO TABS
600.0000 mg | ORAL_TABLET | Freq: Four times a day (QID) | ORAL | Status: DC | PRN
  Administered 2015-06-30 – 2015-07-01 (×3): 600 mg via ORAL
  Filled 2015-06-30 (×3): qty 1

## 2015-06-30 MED ORDER — CEPHALEXIN 500 MG PO CAPS
500.0000 mg | ORAL_CAPSULE | Freq: Three times a day (TID) | ORAL | Status: DC
  Administered 2015-06-30 – 2015-07-01 (×4): 500 mg via ORAL
  Filled 2015-06-30 (×3): qty 1
  Filled 2015-06-30: qty 2
  Filled 2015-06-30: qty 1
  Filled 2015-06-30: qty 2
  Filled 2015-06-30 (×2): qty 1
  Filled 2015-06-30: qty 2

## 2015-06-30 MED ORDER — CITALOPRAM HYDROBROMIDE 20 MG PO TABS
20.0000 mg | ORAL_TABLET | Freq: Every day | ORAL | Status: DC
  Administered 2015-07-01 – 2015-07-02 (×2): 20 mg via ORAL
  Filled 2015-06-30: qty 1
  Filled 2015-06-30: qty 3
  Filled 2015-06-30 (×3): qty 1

## 2015-06-30 NOTE — Progress Notes (Signed)
Patient ID: Leonard Martinez, male   DOB: 1971-04-06, 44 y.o.   MRN: [id number] [facility] MD Progress Note  06/30/2015 7:16 PM Leonard Martinez  MRN:  [id number]   Subjective:  Patient reports partial improvement. He ruminates about his stressors, primarily separation, inability to see his children ( who live with their mother ) more often.  He describes sadness , anxiety, but states he is more resigned to these stressors, and at this time his plan is to return to [place] , where he had been residing, soon after discharge , and return to work. States he will continue working to get better visitation rights. At this time denies medication side effects, but states that Trazodone makes him feel overmedicated and wants to stop this medication.  He reports pain and erythema on L arm ( biceps area) . Denies any IV drug use or any cuts. It appears consistent with cellulitis and he has been started on Keflex . Denies fever , denies chills.   Objective::  Patient seen, chart reviewed, and discussed with staff.   Patient reports ongoing depression, although partially improved compared to admission. At this time denies any suicidal ideations. Of note, he also denies any violent or homicidal ideations towards ex wife . He is future oriented, and planning on returning to [place], where he states he has some support network and good job opportunities . No disruptive behaviors on unit . He has been going to groups. He has been interactive with peers . Responsive to support , empathy  TSH WNL.    Principal Problem: MDD (major depressive disorder), single episode, severe , no psychosis Diagnosis:   Patient Active Problem List   Diagnosis Date Noted  . MDD (major depressive disorder), single episode, severe , no psychosis [F32.2] 06/28/2015  . Cocaine use disorder, mild, abuse [F14.10] 06/28/2015  . Tobacco use disorder [Z72.0] 06/28/2015  . Cannabis use disorder, severe, dependence [F12.20] 06/28/2015  . Alcohol use  disorder, moderate, in sustained remission [F10.99] 06/28/2015  . Suicide attempt [T14.91] 06/27/2015  . Psychoactive substance-induced mood disorder [F19.94, F06.30] 06/26/2015  . Salicylate overdose [T39.091A] 06/25/2015   Total Time spent with patient:  25 minutes   Past Medical History:  Past Medical History  Diagnosis Date  . Hyperlipidemia   . Herniated disc   . Chronic back pain    History reviewed. No pertinent past surgical history. Family History:  Family History  Problem Relation Age of Onset  . Bladder Cancer Mother   . Pancreatic cancer Father    Social History:  History  Alcohol Use  . Yes     History  Drug Use No    Social History   Social History  . Marital Status: Married    Spouse Name: N/A  . Number of Children: N/A  . Years of Education: N/A   Social History Main Topics  . Smoking status: Current Every Day Smoker -- 10.00 packs/day    Types: Cigarettes  . Smokeless tobacco: None  . Alcohol Use: Yes  . Drug Use: No  . Sexual Activity: Yes   Other Topics Concern  . None   Social History Narrative   Additional History:    Sleep: Fair  Appetite:  Good   Assessment:   Musculoskeletal: Strength & Muscle Tone: within normal limits Gait & Station: normal Patient leans: N/A   Psychiatric Specialty Exam: Physical Exam  Nursing note and vitals reviewed. Constitutional: He is oriented to person, place, and time.  Neck: Normal range of motion.  Respiratory: Effort normal.  Musculoskeletal: Normal range of motion.  Neurological: He is alert and oriented to person, place, and time.  Psychiatric: His speech is normal and behavior is normal. Thought content normal. Cognition and memory are normal. He exhibits a depressed mood.    Review of Systems  Psychiatric/Behavioral: Positive for depression and substance abuse. Negative for hallucinations. Suicidal ideas: Denies. The patient is nervous/anxious. Insomnia: Improving.   All other  systems reviewed and are negative. As noted, area of erythema of L arm on biceps area.   Blood pressure 118/65, pulse 65, temperature 97.8 F (36.6 C), temperature source Oral, resp. rate 14, height  (1.702 m), weight 165 lb (74.844 kg), SpO2 100 %.Body mass index is 25.84 kg/(m^2).  General Appearance: Casual and Well Groomed  Eye Contact::  Good  Speech:  Clear and Coherent and Normal Rate  Volume:  Normal  Mood:   States he is feeling better but still depressed   Affect:   Constricted but reactive, does smile briefly at times   Thought Process:  Goal Directed and Linear  Orientation:  Full (Time, Place, and Person)  Thought Content:  Rumination and ruminative about stressors, no hallucinations, no delusions  Suicidal Thoughts:  No Denies at this time  Homicidal Thoughts:  No also, specifically denies any homicidal or violent ideations towards ex wife   Memory:  Immediate;   Good Recent;   Good Remote;   Good  Judgement:  Fair  Insight:  Present  Psychomotor Activity:  Normal- no restlessness or psychomotor agitation  Concentration:  Good  Recall:  Good  Fund of Knowledge:Good  Language: Good  Akathisia:  No  Handed:  Right  AIMS (if indicated):     Assets:  Communication Skills Desire for Improvement Physical Health  ADL's:  Intact  Cognition: WNL  Sleep:  Number of Hours: 5     Current Medications: Current Facility-Administered Medications  Medication Dose Route Frequency Provider Last Rate Last Dose  . acetaminophen (TYLENOL) tablet 650 mg  650 mg Oral Q6H PRN Adonis Brook, NP   650 mg at 06/28/15 0941  . alum & mag hydroxide-simeth (MAALOX/MYLANTA) 200-200-20 MG/5ML suspension 30 mL  30 mL Oral Q4H PRN Adonis Brook, NP      . cephALEXin (KEFLEX) capsule 500 mg  500 mg Oral 3 times per day Thermon Leyland, NP   500 mg at 06/30/15 1629  . citalopram (CELEXA) tablet 10 mg  10 mg Oral Daily Jomarie Longs, MD   10 mg at 06/30/15 0817  . hydrOXYzine  (ATARAX/VISTARIL) tablet 25 mg  25 mg Oral Q6H PRN Jomarie Longs, MD   25 mg at 06/30/15 1301  . ibuprofen (ADVIL,MOTRIN) tablet 600 mg  600 mg Oral Q6H PRN Thermon Leyland, NP   600 mg at 06/30/15 1629  . magnesium hydroxide (MILK OF MAGNESIA) suspension 30 mL  30 mL Oral Daily PRN Adonis Brook, NP      . nicotine (NICODERM CQ - dosed in mg/24 hours) patch 14 mg  14 mg Transdermal Daily Craige Cotta, MD   14 mg at 06/29/15 0816  . traZODone (DESYREL) tablet 50 mg  50 mg Oral QHS PRN Adonis Brook, NP   50 mg at 06/29/15 2156    Lab Results:  Results for orders placed or performed during the hospital encounter of 06/27/15 (from the past 48 hour(s))  TSH     Status: None   Collection Time: 06/29/15  6:51 AM  Result Value  Ref Range   TSH 1.837 0.350 - 4.500 uIU/mL    Comment: Performed at [facility]    Physical Findings: AIMS: Facial and Oral Movements Muscles of Facial Expression: None, normal Lips and Perioral Area: None, normal Jaw: None, normal Tongue: None, normal,Extremity Movements Upper (arms, wrists, hands, fingers): None, normal Lower (legs, knees, ankles, toes): None, normal, Trunk Movements Neck, shoulders, hips: None, normal, Overall Severity Severity of abnormal movements (highest score from questions above): None, normal Incapacitation due to abnormal movements: None, normal Patient's awareness of abnormal movements (rate only patient's report): No Awareness, Dental Status Current problems with teeth and/or dentures?: No Does patient usually wear dentures?: No  CIWA:    COWS:      Assessment- patient remains depressed, but is improved compared to admission, with partially improved mood, more reactive affect , no SI , and is future oriented, wanting to return to [place] soon after discharge and resume work there . Tolerating medications well.    Treatment Plan Summary: Daily contact with patient to assess and evaluate symptoms and progress in  treatment and Medication management   PLAN OF CARE: Continue inpatient treatment, milieu, support conducive to improved mood, improved coping skills to address stressors  Increase Celexa  To 20 mg po daily for depression D/C Trazodone due to side effects. We discussed starting another sleep medication but he states he prefers not to at this time.  Continue Vistaril 25 mg  PRNS  for anxiety sx. Has been started on Keflex for management of infection/cellulitis .  Encourage full abstinence from illicit drugs as part of treatment plan (UDS (+) for cocaine/cannabis upon admission.)  Consider discharge soon as he continues to improve . As noted, patient plans to relocate to [place] after leaving unit.  Will continue with current treatment plan; no changes at this time  Medical Decision Making:  Established Problem, Stable/Improving (1), Review of Psycho-Social Stressors (1), Review of Last Therapy Session (1) and Review of Medication Regimen & Side Effects (2)   Nehemiah Massed, MD 06/30/2015, 7:16 PM

## 2015-06-30 NOTE — Progress Notes (Signed)
Pt's lt upper arm is red and warm to touch. No cuts or skin abrasions noted on upper arm. Pt is currently in the dayroom playing cards with his peers. Reported to NP for evaluation. Safety maintained.

## 2015-06-30 NOTE — Plan of Care (Signed)
Problem: Alteration in mood Goal: LTG-Patient reports reduction in suicidal thoughts (Patient reports reduction in suicidal thoughts and is able to verbalize a safety plan for whenever patient is feeling suicidal)  Outcome: Progressing Patient currently denies suicidal ideations.      

## 2015-06-30 NOTE — Progress Notes (Signed)
Writer has observed patient up in the dayroom laughing and talking with peers playing cards. He reports that he is ready to discharge so that he can get to [place]. He attended group this evening and requested trazadone for sleep. He denies si/hi/a/v hallucinations. Support given, safety maintained on unit with 15 min checks.

## 2015-06-30 NOTE — [facility] Suicide Risk Assessment (Signed)
[facility] INPATIENT:  Family/Significant Other Suicide Prevention Education  Suicide Prevention Education:  Education Completed; Timoteo Ace, Pt's friend [phone], has been identified by the patient as the family member/significant other with whom the patient will be residing, and identified as the person(s) who will aid the patient in the event of a mental health crisis (suicidal ideations/suicide attempt).  With written consent from the patient, the family member/significant other has been provided the following suicide prevention education, prior to the and/or following the discharge of the patient.  The suicide prevention education provided includes the following:  Suicide risk factors  Suicide prevention and interventions  National Suicide Hotline telephone number  [facility] assessment telephone number  [facility] Emergency Assistance 911  [facility] and/or Residential Mobile Crisis Unit telephone number  Request made of family/significant other to:  Remove weapons (e.g., guns, rifles, knives), all items previously/currently identified as safety concern.    Remove drugs/medications (over-the-counter, prescriptions, illicit drugs), all items previously/currently identified as a safety concern.  The family member/significant other verbalizes understanding of the suicide prevention education information provided.  The family member/significant other agrees to remove the items of safety concern listed above.  Elaina Hoops 06/30/2015, 10:37 AM

## 2015-06-30 NOTE — [facility] Group Notes (Signed)
[facility] LCSW Group Therapy  06/30/2015 1:15pm  Type of Therapy:  Group Therapy vercoming Obstacles  Participation Level:  Active  Participation Quality:  Appropriate   Affect:  Appropriate  Cognitive:  Appropriate and Oriented  Insight:  Developing/Improving and Improving  Engagement in Therapy:  Improving  Modes of Intervention:  Discussion, Exploration, Problem-solving and Support  Description of Group:   In this group patients will be encouraged to explore what they see as obstacles to their own wellness and recovery. They will be guided to discuss their thoughts, feelings, and behaviors related to these obstacles. The group will process together ways to cope with barriers, with attention given to specific choices patients can make. Each patient will be challenged to identify changes they are motivated to make in order to overcome their obstacles. This group will be process-oriented, with patients participating in exploration of their own experiences as well as giving and receiving support and challenge from other group members.  Summary of Patient Progress: Pt was active in discussion and identified his current divorce as his main obstacle. Pt was observed to have surface level participation at times, identifying traffic on the way to [place] as an obstacle. 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 He reports a goal of finding structured housing centered on recovery.   Therapeutic Modalities:   Cognitive Behavioral Therapy Solution Focused Therapy Motivational Interviewing Relapse Prevention Therapy   Chad Cordial, LCSWA 06/30/2015 4:49 PM

## 2015-06-30 NOTE — Progress Notes (Signed)
Recreation Therapy Notes  Date: 08.22.2016 Time: 9:30am Location: [address] Dayroom   Group Topic: Stress Management  Goal Area(s) Addresses:  Patient will actively participate in stress management techniques presented during session.   Behavioral Response: Did not attend.   Marykay Lex Garren Greenman, LRT/CTRS  Florella Mcneese L 06/30/2015 1:46 PM

## 2015-06-30 NOTE — Progress Notes (Signed)
D:Pt reports that he did not sleep well last night. He states that he has taken trazodone in the past and it did not help with sleep then either. Pt plans to discuss sleep medications with MD this morning. He talked about going to [facility] following discharge to be near his friends and family.  A:Offered support, encouragement and 15 minute checks. R:Pt denies si and hi. Safety maintained on the unit.

## 2015-06-30 NOTE — Plan of Care (Signed)
Problem: Ineffective individual coping Goal: STG: Patient will remain free from self harm Outcome: Not Progressing Pt denies si thoughts. Goal: STG: Patient will participate in after care plan Outcome: Progressing Pt reports that he plans to go to [facility] following d/c to be near his friends and family members.

## 2015-06-30 NOTE — [facility] Group Notes (Signed)
[facility] LCSW Aftercare Discharge Planning Group Note  06/30/2015 8:45 AM  Participation Quality: Alert, Appropriate and Oriented  Mood/Affect: Appropriate; "Tired"  Depression Rating: 2  Anxiety Rating: 2  Thoughts of Suicide: Pt denies SI/HI  Will you contract for safety? Yes  Current AVH: Pt denies  Plan for Discharge/Comments: Pt attended discharge planning group and actively participated in group. CSW discussed suicide prevention education with the group and encouraged them to discuss discharge planning and any relevant barriers. Pt reports that he recently moved to [place], [place] and will need providers there. He reports being in a difficult custody battle with his ex-wife.  Transportation Means: Pt reports access to transportation  Supports: Aunt  Chad Cordial, LCSWA 06/30/2015 9:39 AM

## 2015-07-01 MED ORDER — RAMELTEON 8 MG PO TABS
8.0000 mg | ORAL_TABLET | Freq: Every day | ORAL | Status: DC
  Administered 2015-07-01: 8 mg via ORAL
  Filled 2015-07-01: qty 3
  Filled 2015-07-01 (×3): qty 1

## 2015-07-01 MED ORDER — CEPHALEXIN 500 MG PO CAPS
500.0000 mg | ORAL_CAPSULE | Freq: Three times a day (TID) | ORAL | Status: DC
  Administered 2015-07-01 – 2015-07-02 (×3): 500 mg via ORAL
  Filled 2015-07-01 (×2): qty 18
  Filled 2015-07-01 (×4): qty 1
  Filled 2015-07-01: qty 18
  Filled 2015-07-01: qty 1

## 2015-07-01 NOTE — [facility] Group Notes (Signed)
The focus of this group is to educate the patient on the purpose and policies of crisis stabilization and provide a format to answer questions about their admission.  The group details unit policies and expectations of patients while admitted.  Patient did not attend 0900 nurse education orientation group this morning.  Patient stayed in bed.   

## 2015-07-01 NOTE — [facility] Group Notes (Signed)
[facility] LCSW Group Therapy  07/01/2015   1:15 PM   Type of Therapy:  Group Therapy  Participation Level:  Active  Participation Quality:  Attentive, Sharing and Supportive  Affect:  Appropriate  Cognitive:  Alert and Oriented  Insight:  Developing/Improving and Engaged  Engagement in Therapy:  Developing/Improving and Engaged  Modes of Intervention:  Clarification, Confrontation, Discussion, Education, Exploration, Limit-setting, Orientation, Problem-solving, Rapport Building, Dance movement psychotherapist, Socialization and Support  Summary of Progress/Problems: The topic for group therapy was feelings about diagnosis.  Pt actively participated in group discussion on their past and current diagnosis and how they feel towards this.  Pt also identified how society and family members judge them, based on their diagnosis as well as stereotypes and stigmas.  Patient reports feeling "mixed emotions" about his hospitalization, reporting that he has found some staff and policies helpful and others not as much. He expressed viewing his diagnosis as a negative "label" and stated that the goal of the hospitalization is to diagnosis, not treatment. CSW explored this with group. Patient discussed briefly his relationship issues with his children's mother and his overdose. CSW and other group members provided patient with emotional support and encouragement.  Samuella Bruin, MSW, [employer] Clinical Social Worker [facility] [phone]

## 2015-07-01 NOTE — Progress Notes (Signed)
Recreation Therapy Notes Animal-Assisted Activity (AAA) Program Checklist/Progress Notes Patient Eligibility Criteria Checklist & Daily Group note for Rec Tx Intervention  Date: 08.23.2016 Time: 2:45pm Location: [address]    AAA/T Program Assumption of Risk Form signed by Patient/ or Parent Legal Guardian yes  Patient is free of allergies or sever asthma yes  Patient reports no fear of animals yes  Patient reports no history of cruelty to animals yes  Patient understands his/her participation is voluntary yes  Patient washes hands before animal contact yes  Patient washes hands after animal contact yes  Behavioral Response: Attentive, Appropriate   Education: Hand Washing, Appropriate Animal Interaction   Education Outcome: Acknowledges understanding   Clinical Observations/Feedback: Patient actively engaged in session, petting therapy dog appropriately. Additionally patient engaged appropriately with peers and handler.   Marykay Lex Myan Locatelli, LRT/CTRS  Flavia Bruss L 07/01/2015 3:06 PM

## 2015-07-01 NOTE — Progress Notes (Signed)
Patient ID: Leonard Martinez, male   DOB: [birth year], 44 y.o.   MRN: [id number] [facility] MD Progress Note  07/01/2015 2:22 PM Leonard Martinez  MRN:  [id number]   Subjective:  Patient  States he is  Having a " not so good day" which he attributes to poor sleep last night. Trazodone had been stopped due to side effects. He does state that in general he is feeling better than upon admission. At this time he denies medication side effects. He remains future oriented, and plans to relocate to [place] promptly after discharging, with a plan of getting outpatient psychiatric services there. Pain, erythema of R  Arm subsiding with antibiotherapy.  Objective::  Patient seen, chart reviewed, and discussed with staff.   Some group participation, but today reporting feeling more tired due to his insomnia. No medication side effects, and does feel medications are helping .  No disruptive behaviors on unit .   Today in bed, but pleasant and cooperative upon approach, focused on poor sleep last night . States " I tossed and turned all night long ".  As noted above, cellulitis of R arm improving on Keflex trial. Patient future oriented and planning to relocate to [place] , where he has friends and a support network, when he is discharged. States he will follow up with an outpatient provider there. Denies any SI, denies any HI, in particular also denies any violent or homicidal ideations towards ex wife .     Principal Problem: MDD (major depressive disorder), single episode, severe , no psychosis Diagnosis:   Patient Active Problem List   Diagnosis Date Noted  . MDD (major depressive disorder), single episode, severe , no psychosis [F32.2] 06/28/2015  . Cocaine use disorder, mild, abuse [F14.10] 06/28/2015  . Tobacco use disorder [Z72.0] 06/28/2015  . Cannabis use disorder, severe, dependence [F12.20] 06/28/2015  . Alcohol use disorder, moderate, in sustained remission [F10.99] 06/28/2015  . Suicide attempt  [T14.91] 06/27/2015  . Psychoactive substance-induced mood disorder [F19.94, F06.30] 06/26/2015  . Salicylate overdose [T39.091A] 06/25/2015   Total Time spent with patient:  25 minutes   Past Medical History:  Past Medical History  Diagnosis Date  . Hyperlipidemia   . Herniated disc   . Chronic back pain    History reviewed. No pertinent past surgical history. Family History:  Family History  Problem Relation Age of Onset  . Bladder Cancer Mother   . Pancreatic cancer Father    Social History:  History  Alcohol Use  . Yes     History  Drug Use No    Social History   Social History  . Marital Status: Married    Spouse Name: N/A  . Number of Children: N/A  . Years of Education: N/A   Social History Main Topics  . Smoking status: Current Every Day Smoker -- 10.00 packs/day    Types: Cigarettes  . Smokeless tobacco: None  . Alcohol Use: Yes  . Drug Use: No  . Sexual Activity: Yes   Other Topics Concern  . None   Social History Narrative   Additional History:    Sleep: poor  Appetite:  Good   Assessment:   Musculoskeletal: Strength & Muscle Tone: within normal limits Gait & Station: normal Patient leans: N/A   Psychiatric Specialty Exam: Physical Exam  Nursing note and vitals reviewed. Constitutional: He is oriented to person, place, and time.  Neck: Normal range of motion.  Respiratory: Effort normal.  Musculoskeletal: Normal range of motion.  Neurological: He  is alert and oriented to person, place, and time.  Psychiatric: His speech is normal and behavior is normal. Thought content normal. Cognition and memory are normal. He exhibits a depressed mood.    Review of Systems  Psychiatric/Behavioral: Positive for depression and substance abuse. Negative for hallucinations. Suicidal ideas: Denies. The patient is nervous/anxious. Insomnia: Improving.   All other systems reviewed and are negative. As noted, area of erythema of L arm on biceps area.    Blood pressure 132/80, pulse 74, temperature 98 F (36.7 C), temperature source Oral, resp. rate 18, height 5\' 7"  (1.702 m), weight 165 lb (74.844 kg), SpO2 100 %.Body mass index is 25.84 kg/(m^2).  General Appearance: Casual and Well Groomed  Eye Contact::  Good  Speech:  Clear and Coherent and Normal Rate  Volume:  Normal  Mood:    Improved but still tends to be depressed, dysphoric   Affect:   Mildly constricted and irritable   Thought Process:  Goal Directed and Linear  Orientation:  Full (Time, Place, and Person)  Thought Content:  Rumination and ruminative about stressors, no hallucinations, no delusions  Suicidal Thoughts:  No Denies at this time  Homicidal Thoughts:  No also, specifically denies any homicidal or violent ideations towards ex wife   Memory:  Immediate;   Good Recent;   Good Remote;   Good  Judgement:  Fair  Insight:  Present  Psychomotor Activity:  Normal  Concentration:  Good  Recall:  Good  Fund of Knowledge:Good  Language: Good  Akathisia:  No  Handed:  Right  AIMS (if indicated):     Assets:  Communication Skills Desire for Improvement Physical Health  ADL's:  Intact  Cognition: WNL  Sleep:  Number of Hours: 5     Current Medications: Current Facility-Administered Medications  Medication Dose Route Frequency Provider Last Rate Last Dose  . acetaminophen (TYLENOL) tablet 650 mg  650 mg Oral Q6H PRN Adonis Brook, NP   650 mg at 07/01/15 0339  . alum & mag hydroxide-simeth (MAALOX/MYLANTA) 200-200-20 MG/5ML suspension 30 mL  30 mL Oral Q4H PRN Adonis Brook, NP      . cephALEXin (KEFLEX) capsule 500 mg  500 mg Oral 3 times per day Thermon Leyland, NP   500 mg at 07/01/15 0981  . citalopram (CELEXA) tablet 20 mg  20 mg Oral Daily Craige Cotta, MD   20 mg at 07/01/15 0756  . hydrOXYzine (ATARAX/VISTARIL) tablet 25 mg  25 mg Oral Q6H PRN Jomarie Longs, MD   25 mg at 07/01/15 0817  . ibuprofen (ADVIL,MOTRIN) tablet 600 mg  600 mg Oral Q6H PRN  Thermon Leyland, NP   600 mg at 07/01/15 1137  . magnesium hydroxide (MILK OF MAGNESIA) suspension 30 mL  30 mL Oral Daily PRN Adonis Brook, NP      . nicotine (NICODERM CQ - dosed in mg/24 hours) patch 14 mg  14 mg Transdermal Daily Craige Cotta, MD   14 mg at 06/29/15 1914    Lab Results:  No results found for this or any previous visit (from the past 48 hour(s)).  Physical Findings: AIMS: Facial and Oral Movements Muscles of Facial Expression: None, normal Lips and Perioral Area: None, normal Jaw: None, normal Tongue: None, normal,Extremity Movements Upper (arms, wrists, hands, fingers): None, normal Lower (legs, knees, ankles, toes): None, normal, Trunk Movements Neck, shoulders, hips: None, normal, Overall Severity Severity of abnormal movements (highest score from questions above): None, normal Incapacitation due to  abnormal movements: None, normal Patient's awareness of abnormal movements (rate only patient's report): No Awareness, Dental Status Current problems with teeth and/or dentures?: No Does patient usually wear dentures?: No  CIWA:    COWS:      Assessment- patient  Reports some ongoing depression, dysphoria, but in general has improved since admission. No SI, no HI, and future oriented, intending to go to [place] after discharge and getting a job there .  Poor sleep  Is his major concern at this time( Trazodone was stopped due to dry mouth , other side effects) . We discussed options and agrees to Rozerem trial.  Cellulitis of arm is improving . Today no chills, no fever , and area of pain /erythema improved.  Treatment Plan Summary: Daily contact with patient to assess and evaluate symptoms and progress in treatment and Medication management   PLAN OF CARE: Continue inpatient treatment, milieu, support conducive to improved mood, improved coping skills to address stressors  Continue Celexa 20 mg po daily for depression Start Rozerem 8 mgrs QHS for insomnia .   Continue Vistaril 25 mg  PRNS  for anxiety sx. Continue  Keflex for management of infection/cellulitis .  Encourage full abstinence from illicit drugs as part of treatment plan (UDS (+) for cocaine/cannabis upon admission.)  Patient  plans to relocate to [place] after leaving unit.  Will continue with current treatment plan; no changes at this time  Medical Decision Making:  Established Problem, Stable/Improving (1), Review of Psycho-Social Stressors (1), Review of Last Therapy Session (1) and Review of Medication Regimen & Side Effects (2)   Nehemiah Massed, MD 07/01/2015, 2:22 PM

## 2015-07-01 NOTE — Progress Notes (Signed)
D. Pt had been up and visible in milieu this evening, did attend and participate in evening group activity. Pt did mention that he had been having difficulties with sleep however he spoke about how he was wanting to try to sleep without any extra medications this evening. Pt still complaining of redness and swelling in left arm and is painful and warm to the touch. Pt did receive medications without incident this evening. A. Support and encouragement provided. R. Safety maintained, will continue to monitor.

## 2015-07-01 NOTE — Progress Notes (Signed)
Pt has been up throughout the evening, unable to get much sleep, has been in room reading a book also up in hallways at times.

## 2015-07-01 NOTE — Progress Notes (Signed)
NSG shift assessment. 7a-7p.   D: This morning pt was anxious and irritable because he was not able to get sleep last night. He was angry that he has not been given sleeping medication. To complicate his sleep disturbance, his roommate was up all night vomiting. He completed a self inventory and in rated his depression as 0/10, hopelessness as 0/10 and anxiety as 7/10. He has an apparent infection in his left arm acquired at the hospital from IV and is receiving antibiotics for that. At 6 am staff woke him up to give him his antibiotic and he complained that they woke him up. His goal today is to get some sleep. He feels that people here should listen and judge less. During the afternoon he felt a little better and apologized for his irritability.   A: Gave medication for anxiety. Changed the administration times for his antibiotic so that he will not be disturbed early.  Observed pt interacting in group and in the milieu: Support and encouragement offered. Safety maintained with observations every 15 minutes.   R:   Contracts for safety.

## 2015-07-01 NOTE — Tx Team (Signed)
Interdisciplinary Treatment Plan Update (Adult) Date: 07/01/2015   Date: 07/01/2015 5:45 PM  Progress in Treatment:  Attending groups: Yes  Participating in groups: Yes  Taking medication as prescribed: Yes  Tolerating medication: Yes  Family/Significant othe contact made: Yes, with friend Patient understands diagnosis: Yes Discussing patient identified problems/goals with staff: Yes  Medical problems stabilized or resolved: Yes  Denies suicidal/homicidal ideation: Yes Patient has not harmed self or Others: Yes   New problem(s) identified: None identified at this time.   Discharge Plan or Barriers: Pt plans to return to [facility] and follow-up with outpatient resources  Additional comments: n/a   Reason for Continuation of Hospitalization:  Depression Medication stabilization Suicidal ideation  Estimated length of stay: 2-3 days  Review of initial/current patient goals per problem list:   1.  Goal(s): Patient will participate in aftercare plan  Met:  Yes  Target date: 3-5 days from date of admission   As evidenced by: Patient will participate within aftercare plan AEB aftercare provider and housing plan at discharge being identified.   07/01/15: Pt will discharge back to [facility] and follow-up with outpatient providers there.  2.  Goal (s): Patient will exhibit decreased depressive symptoms and suicidal ideations.  Met:  Yes  Target date: 3-5 days from date of admission   As evidenced by: Patient will utilize self rating of depression at 3 or below and demonstrate decreased signs of depression or be deemed stable for discharge by MD.  07/01/15: pt rates depression at 2/10 and denies SI.  Attendees:  Patient:    Family:    Physician: Dr. Parke Poisson, MD  07/01/2015 5:45 PM  Nursing: Lars Pinks, RN Case manager  07/01/2015 5:45 PM  Clinical Social Worker Norman Clay, MSW 07/01/2015 5:45 PM  Other: Jake Bathe Liasion 07/01/2015 5:45 PM   Clinical:  Grayland Ormond, RN 07/01/2015 5:45 PM  Other: , RN Charge Nurse 07/01/2015 5:45 PM  Other:     Peri Maris, [place] MSW

## 2015-07-02 MED ORDER — CITALOPRAM HYDROBROMIDE 20 MG PO TABS: 20.0000 mg | ORAL_TABLET | Freq: Every day | ORAL | Status: DC

## 2015-07-02 MED ORDER — RAMELTEON 8 MG PO TABS: 8.0000 mg | ORAL_TABLET | Freq: Every day | ORAL | Status: DC

## 2015-07-02 MED ORDER — NICOTINE 14 MG/24HR TD PT24: 14.0000 mg | MEDICATED_PATCH | Freq: Every day | TRANSDERMAL | Status: DC

## 2015-07-02 MED ORDER — HYDROXYZINE HCL 25 MG PO TABS: 25.0000 mg | ORAL_TABLET | Freq: Four times a day (QID) | ORAL | Status: DC | PRN

## 2015-07-02 MED ORDER — CEPHALEXIN 500 MG PO CAPS: 500.0000 mg | ORAL_CAPSULE | Freq: Three times a day (TID) | ORAL | Status: DC

## 2015-07-02 NOTE — BHH Suicide Risk Assessment (Signed)
[facility] Discharge Suicide Risk Assessment   Demographic Factors:  44 year old divorced male, has two children, currently lives in [place]   Total Time spent with patient: 30 minutes  Musculoskeletal: Strength & Muscle Tone: within normal limits Gait & Station: normal Patient leans: N/A  Psychiatric Specialty Exam: Physical Exam  ROS  Blood pressure 132/80, pulse 74, temperature 98 F (36.7 C), temperature source Oral, resp. rate 18, height  (1.702 m), weight 165 lb (74.844 kg), SpO2 100 %.Body mass index is 25.84 kg/(m^2).  General Appearance: Neat  Eye Contact::  Good  Speech:  Normal Rate409  Volume:  Normal  Mood:  improved, euthymic   Affect:  Appropriate- not irritable   Thought Process:  Linear  Orientation:  Full (Time, Place, and Person)  Thought Content:  Rumination and no hallucinations, no delusions, not internally preoccupied   Suicidal Thoughts:  No  Homicidal Thoughts:  No-also specifically denies any thoughts of violence or HI towards ex wife   Memory:  recent and remote grossly intact   Judgement:  Other:  improved   Insight:  improved  Psychomotor Activity:  Normal  Concentration:  Good  Recall:  Good  Fund of Knowledge:Good  Language: Good  Akathisia:  Negative  Handed:  Right  AIMS (if indicated):     Assets:  Communication Skills Desire for Improvement Resilience  Sleep:  Number of Hours: 5  Cognition: WNL  ADL's:  Intact   Have you used any form of tobacco in the last 30 days? (Cigarettes, Smokeless Tobacco, Cigars, and/or Pipes): Yes  Has this patient used any form of tobacco in the last 30 days? (Cigarettes, Smokeless Tobacco, Cigars, and/or Pipes) Yes, A prescription for an FDA-approved tobacco cessation medication was offered at discharge and the patient refused  Mental Status Per Nursing Assessment::   On Admission:  Self-harm thoughts  Current Mental Status by Physician: At this time he is fully alert and attentive, well related, mood  euthymic, affect appropriate, denies depression , no thought disorder, no SI , no HI, no psychotic symptoms.    Loss Factors: Divorce, not having custody of children, living out of state, far from children  Historical Factors: No prior suicide attempts, one prior psychiatric admission at age 83.   Risk Reduction Factors:   Sense of responsibility to family, Employed, Positive social support and Positive coping skills or problem solving skills  Continued Clinical Symptoms:  At this time patient improved compared to admission- mood euthymic, affect appropriate, full in range, no SI or HI, future oriented, plans to attend court date on Friday and then go to [place], where he has a place to stay and a support system, and plans to return to work soon, and to go to college  Of note, at this time his L  arm cellulitis is much improved , erythema has essentially disappeared and he has no pain Cognitive Features That Contribute To Risk:  No gross cognitive deficits noted upon discharge. Is alert , attentive, and oriented x 3   Suicide Risk:  Mild:  Suicidal ideation of limited frequency, intensity, duration, and specificity.  There are no identifiable plans, no associated intent, mild dysphoria and related symptoms, good self-control (both objective and subjective assessment), few other risk factors, and identifiable protective factors, including available and accessible social support.  Principal Problem: MDD (major depressive disorder), single episode, severe , no psychosis Discharge Diagnoses:  Patient Active Problem List   Diagnosis Date Noted  . MDD (major depressive disorder),  single episode, severe , no psychosis [F32.2] 06/28/2015  . Cocaine use disorder, mild, abuse [F14.10] 06/28/2015  . Tobacco use disorder [Z72.0] 06/28/2015  . Cannabis use disorder, severe, dependence [F12.20] 06/28/2015  . Alcohol use disorder, moderate, in sustained remission [F10.99] 06/28/2015  . Suicide attempt  [T14.91] 06/27/2015  . Psychoactive substance-induced mood disorder [F19.94, F06.30] 06/26/2015  . Salicylate overdose [T39.091A] 06/25/2015      Plan Of Care/Follow-up recommendations:  Activity:  as tolerated  Diet:  regular  Tests:  NA Other:  see below   Is patient on multiple antipsychotic therapies at discharge:   No    Do you recommend tapering to monotherapy for antipsychotics?  No   Has Patient had three or more failed trials of antipsychotic monotherapy by history:  No  Recommended Plan for Multiple Antipsychotic Therapies: NA  Patient leaving in good spirits . Plans to follow up in [place] for outpatient psychiatric services .   COBOS, FERNANDO 07/02/2015, 10:32 AM

## 2015-07-02 NOTE — Progress Notes (Signed)
  [facility] Adult Case Management Discharge Plan :  Will you be returning to the same living situation after discharge:  Yes,  Pt returning to aunt's home in [place], [place] At discharge, do you have transportation home?: Yes,  Pt's friend Amy provided transportation Do you have the ability to pay for your medications: Yes,  Pt provided with samples and supply  Release of information consent forms completed and in the chart;  Patient's signature needed at discharge.  Patient to Follow up at: Follow-up Information    Follow up with [facility].   Why:  Their staff will call you within 7-10 business days to schedule your initial appointment.   Contact information:   [address] [place] [address] [phone] Fax: [phone]      Patient denies SI/HI: Yes,  Pt denies    Safety Planning and Suicide Prevention discussed: Yes,  with friend; See SPE note for further details  Have you used any form of tobacco in the last 30 days? (Cigarettes, Smokeless Tobacco, Cigars, and/or Pipes): Yes  Has patient been referred to the Quitline?: Patient refused referral  Elaina Hoops 07/02/2015, 8:29 PM

## 2015-07-02 NOTE — Discharge Summary (Signed)
Physician Discharge Summary Note  Patient:  Leonard Martinez is an 44 y.o., male MRN:  [id number] DOB:  01-13-1971 Patient phone:  [phone] (home)  Patient address:   [address] [place] [address],  Total Time spent with patient: 45 minutes  Date of Admission:  06/27/2015 Date of Discharge: 07/02/2015  Reason for Admission:  depression  Principal Problem: MDD (major depressive disorder), single episode, severe , no psychosis Discharge Diagnoses: Patient Active Problem List   Diagnosis Date Noted  . MDD (major depressive disorder), single episode, severe , no psychosis [F32.2] 06/28/2015  . Cocaine use disorder, mild, abuse [F14.10] 06/28/2015  . Tobacco use disorder [Z72.0] 06/28/2015  . Cannabis use disorder, severe, dependence [F12.20] 06/28/2015  . Alcohol use disorder, moderate, in sustained remission [F10.99] 06/28/2015  . Suicide attempt [T14.91] 06/27/2015  . Psychoactive substance-induced mood disorder [F19.94, F06.30] 06/26/2015  . Salicylate overdose [T39.091A] 06/25/2015    Musculoskeletal: Strength & Muscle Tone: within normal limits Gait & Station: normal Patient leans: N/A  Psychiatric Specialty Exam: Physical Exam  Vitals reviewed.   Review of Systems  All other systems reviewed and are negative.   Blood pressure 132/80, pulse 74, temperature 98 F (36.7 C), temperature source Oral, resp. rate 18, height 5\' 7"  (1.702 m), weight 74.844 kg (165 lb), SpO2 100 %.Body mass index is 25.84 kg/(m^2).   General Appearance: Neat  Eye Contact:: Good  Speech: Normal Rate  Volume: Normal  Mood: improved, euthymic   Affect: Appropriate- not irritable   Thought Process: Linear  Orientation: Full (Time, Place, and Person)  Thought Content: Rumination and no hallucinations, no delusions, not internally preoccupied   Suicidal Thoughts: No  Homicidal Thoughts: No-also specifically denies any thoughts of violence or HI towards ex wife    Memory: recent and remote grossly intact   Judgement: Other: improved   Insight: improved  Psychomotor Activity: Normal  Concentration: Good  Recall: Good  Fund of Knowledge:Good  Language: Good  Akathisia: Negative  Handed: Right  AIMS (if indicated):    Assets: Communication Skills Desire for Improvement Resilience  Sleep: Number of Hours:  6.5  Cognition: WNL  ADL's: Intact       Have you used any form of tobacco in the last 30 days? (Cigarettes, Smokeless Tobacco, Cigars, and/or Pipes): Yes  Has this patient used any form of tobacco in the last 30 days? (Cigarettes, Smokeless Tobacco, Cigars, and/or Pipes) Yes, A prescription for an FDA-approved tobacco cessation medication was offered at discharge and the patient refused  Past Medical History:  Past Medical History  Diagnosis Date  . Hyperlipidemia   . Herniated disc   . Chronic back pain    History reviewed. No pertinent past surgical history. Family History:  Family History  Problem Relation Age of Onset  . Bladder Cancer Mother   . Pancreatic cancer Father    Social History:  History  Alcohol Use  . Yes     History  Drug Use No    Social History   Social History  . Marital Status: Married    Spouse Name: N/A  . Number of Children: N/A  . Years of Education: N/A   Social History Main Topics  . Smoking status: Current Every Day Smoker -- 10.00 packs/day    Types: Cigarettes  . Smokeless tobacco: None  . Alcohol Use: Yes  . Drug Use: No  . Sexual Activity: Yes   Other Topics Concern  . None   Social History Narrative  Risk to Self: Is patient at risk for suicide?: Yes What has been your use of drugs/alcohol within the last 12 months?: Marijuana - "Habitual smoker" but is "cleaning that up" due to job constraints; Cocaine - 3 times in last 12 months; Alcohol - 1-2 drinks out with dinner, not regularly Risk to Others:   Prior Inpatient Therapy:   Prior Outpatient  Therapy:    Level of Care:  OP  Hospital Course:  Leonard Martinez was admitted for MDD (major depressive disorder), single episode, severe , no psychosis and crisis management.  He was treated discharged with the medications listed below under Medication List.  Medical problems were identified and treated as needed.  Home medications were restarted as appropriate.  Improvement was monitored by observation and Leonard Martinez daily report of symptom reduction.  Emotional and mental status was monitored by daily self-inventory reports completed by Leonard Martinez and clinical staff.         Leonard Martinez was evaluated by the treatment team for stability and plans for continued recovery upon discharge.  Leonard Martinez motivation was an integral factor for scheduling further treatment.  Employment, transportation, bed availability, health status, family support, and any pending legal issues were also considered during his hospital stay.  He was offered further treatment options upon discharge including but not limited to Residential, Intensive Outpatient, and Outpatient treatment.  Leonard Martinez will follow up with the services as listed below under Follow Up Information.     Upon completion of this admission the patient was both mentally and medically stable for discharge denying suicidal/homicidal ideation, auditory/visual/tactile hallucinations, delusional thoughts and paranoia.      Consults:  psychiatry  Significant Diagnostic Studies:  labs: per ED  Discharge Vitals:   Blood pressure 132/80, pulse 74, temperature 98 F (36.7 C), temperature source Oral, resp. rate 18, height  (1.702 m), weight 74.844 kg (165 lb), SpO2 100 %. Body mass index is 25.84 kg/(m^2). Lab Results:   No results found for this or any previous visit (from the past 72 hour(s)).  Physical Findings: AIMS: Facial and Oral Movements Muscles of Facial Expression: None, normal Lips and Perioral Area: None, normal Jaw:  None, normal Tongue: None, normal,Extremity Movements Upper (arms, wrists, hands, fingers): None, normal Lower (legs, knees, ankles, toes): None, normal, Trunk Movements Neck, shoulders, hips: None, normal, Overall Severity Severity of abnormal movements (highest score from questions above): None, normal Incapacitation due to abnormal movements: None, normal Patient's awareness of abnormal movements (rate only patient's report): No Awareness, Dental Status Current problems with teeth and/or dentures?: No Does patient usually wear dentures?: No  CIWA:    COWS:      See Psychiatric Specialty Exam and Suicide Risk Assessment completed by Attending Physician prior to discharge.  Discharge destination:  Home  Is patient on multiple antipsychotic therapies at discharge:  No   Has Patient had three or more failed trials of antipsychotic monotherapy by history:  No  Recommended Plan for Multiple Antipsychotic Therapies: NA     Medication List    TAKE these medications      Indication   cephALEXin 500 MG capsule  Commonly known as:  KEFLEX  Take 1 capsule (500 mg total) by mouth 3 (three) times daily.   Indication:  Infection of the Skin and Skin Structures     citalopram 20 MG tablet  Commonly known as:  CELEXA  Take 1 tablet (20 mg total) by mouth daily.   Indication:  Depression  hydrOXYzine 25 MG tablet  Commonly known as:  ATARAX/VISTARIL  Take 1 tablet (25 mg total) by mouth every 6 (six) hours as needed for anxiety.   Indication:  Anxiety Neurosis     nicotine 14 mg/24hr patch  Commonly known as:  NICODERM CQ - dosed in mg/24 hours  Place 1 patch (14 mg total) onto the skin daily.   Indication:  Nicotine Addiction     ramelteon 8 MG tablet  Commonly known as:  ROZEREM  Take 1 tablet (8 mg total) by mouth at bedtime.   Indication:  Trouble Sleeping           Follow-up Information    Follow up with [facility].   Why:  Their staff will call  you within 7-10 business days to schedule your initial appointment.   Contact information:   [address] [place] [address] [phone] Fax: [phone]      Follow-up recommendations:  Activity:  as tol Diet:  as tol  Comments:  1.  Take all your medications as prescribed.              2.  Report any adverse side effects to outpatient provider.                       3.  Patient instructed to not use alcohol or illegal drugs while on prescription medicines.            4.  In the event of worsening symptoms, instructed patient to call 911, the crisis hotline or go to nearest emergency room for evaluation of symptoms.  Total Discharge Time:  40 min  Signed: Velna Hatchet May Agustin AGNP-BC 07/02/2015, 2:42 PM  Patient seen, Suicide Assessment Completed.  Disposition Plan Reviewed

## 2015-07-02 NOTE — [facility] Group Notes (Signed)
[facility] LCSW Aftercare Discharge Planning Group Note  07/02/2015 8:45 AM  Participation Quality: Alert, Appropriate and Oriented  Mood/Affect: Appropriate  Depression Rating: 0  Anxiety Rating: 2  Thoughts of Suicide: Pt denies SI/HI  Will you contract for safety? Yes  Current AVH: Pt denies  Plan for Discharge/Comments: Pt attended discharge planning group and actively participated in group. CSW discussed suicide prevention education with the group and encouraged them to discuss discharge planning and any relevant barriers. Pt reports feeling better, however is requesting more anxiety medication so he can "stay ahead of it." Pt reports stress related to child custody matters that he is having trouble navigating.  Transportation Means: Pt reports access to transportation  Supports: No supports mentioned at this time  Chad Cordial, LCSWA 07/02/2015 9:20 AM

## 2015-07-03 NOTE — ED Notes (Signed)
I was in pts chart trying to find where belonging the patient said he could not find after being released from hospital, giving info to A/D Barbara Cower

## 2017-01-22 ENCOUNTER — Ambulatory Visit (HOSPITAL_COMMUNITY)
Admission: AD | Admit: 2017-01-22 | Discharge: 2017-01-22 | Disposition: A | Discharge status: Home / Self Care | Payer: BLUE CROSS/BLUE SHIELD | Attending: Psychiatry | Admitting: Psychiatry

## 2017-01-22 DIAGNOSIS — F329 Major depressive disorder, single episode, unspecified: Secondary | ICD-10-CM | POA: Diagnosis not present

## 2017-01-22 NOTE — H&P (Signed)
Behavioral Health Medical Screening Exam  Leonard Martinez is an 46 y.o. male.  Total Time spent with patient: 20 minutes  Psychiatric Specialty Exam: Physical Exam  Constitutional: He is oriented to person, place, and time. He appears well-developed and well-nourished. No distress.  HENT:  Head: Normocephalic and atraumatic.  Right Ear: External ear normal.  Left Ear: External ear normal.  Eyes: Conjunctivae are normal. Pupils are equal, round, and reactive to light. Right eye exhibits no discharge. Left eye exhibits no discharge. No scleral icterus.  Neck: Normal range of motion.  Cardiovascular: Normal rate, regular rhythm and normal heart sounds.   Respiratory: Effort normal and breath sounds normal.  Musculoskeletal: Normal range of motion.  Neurological: He is alert and oriented to person, place, and time.  Skin: Skin is warm and dry. He is not diaphoretic.  Psychiatric: His speech is normal and behavior is normal. His mood appears not anxious. His affect is not blunt, not labile and not inappropriate. Thought content is not paranoid and not delusional. Cognition and memory are normal. He does not express impulsivity or inappropriate judgment. He exhibits a depressed mood. He expresses no homicidal and no suicidal ideation.    Review of Systems  Psychiatric/Behavioral: Positive for depression. Negative for hallucinations, substance abuse and suicidal ideas. The patient is not nervous/anxious and does not have insomnia.   All other systems reviewed and are negative.   Blood pressure 130/90, pulse 85, temperature 98.4 F (36.9 C), temperature source Oral, resp. rate 16, SpO2 100 %.There is no height or weight on file to calculate BMI.  General Appearance: Well Groomed  Eye Contact:  Good  Speech:  Clear and Coherent and Normal Rate  Volume:  Normal  Mood:  Depressed and Hopeless  Affect:  Congruent and Depressed  Thought Process:  Coherent and Goal Directed  Orientation:  Full  (Time, Place, and Person)  Thought Content:  Logical and Hallucinations: None Reports that he feels as if he is having an internal argument with himself about moving to [place]. He stated to TTS that he was hearing voices. He informed this Clinical research associatewriter that the voices are not external and that they are "not real voices, more of a feeling."  Suicidal Thoughts:  No  Homicidal Thoughts:  No  Memory:  Immediate;   Good Recent;   Good Remote;   Good  Judgement:  Intact  Insight:  Good  Psychomotor Activity:  Normal  Concentration: Concentration: Good and Attention Span: Good  Recall:  Good  Fund of Knowledge:Good  Language: Good  Akathisia:  NA  Handed:  Right  AIMS (if indicated):     Assets:  Communication Skills Desire for Improvement Physical Health  Sleep:       Musculoskeletal: Strength & Muscle Tone: within normal limits Gait & Station: normal   Blood pressure 130/90, pulse 85, temperature 98.4 F (36.9 C), temperature source Oral, resp. rate 16, SpO2 100 %.  Recommendations:  Based on my evaluation the patient does not appear to have an emergency medical condition. TTS provided with outpatient resources.   Jackelyn PolingJason A Berry, NP 01/22/2017, 10:21 PM

## 2017-01-22 NOTE — BH Assessment (Addendum)
Tele Assessment Note   Leonard Martinez is an 46 y.o. male who presents to [facility] as a walk-in. Pt reports increased anxiety with an inner voice telling him to do impulsive things. Pt stated "I had an argument with myself for 30 minutes the other day in the mirror and I know that's not right." Pt reports his inner voice is telling him to move to [place] and he does not understand why because he does not know anyone in [place]. Pt identifies recent stressors as losing his job, not being able to see his children, and divorce. Pt reports he moved from [place] to [place] about 2 weeks ago in order to be closer to his children but he has not seen them due to not being able to leave his home.    Pt reports he has been sleeping for more than 12 hours a day and when he is awake he lays in the bed and does not get out of bed. Pt reports he has racing thoughts about "what is going to happen, what has already happened, and what he could have done differently in the past to change the way certain things have happened." Pt denies SI or HI. Pt reports he has attempted suicide in the past by an OD when his wife left him. Pt denies any current thoughts or feelings of suicide.    Per Nira Conn, NP pt does not meet criteria for inpt treatment. Pt was given referrals for OPT therapy to follow up in order to begin counseling and therapy services to address issues of anxiety and depression.     Diagnosis: Unspecified Anxiety D/O; Unspecified Depressive D/O   Past Medical History:  Past Medical History:  Diagnosis Date   Chronic back pain    Herniated disc    Hyperlipidemia     No past surgical history on file.  Family History:  Family History  Problem Relation Age of Onset   Bladder Cancer Mother    Pancreatic cancer Father     Social History:  reports that he has been smoking Cigarettes.  He has been smoking about 10.00 packs per day. He does not have any smokeless tobacco history on file. He  reports that he drinks alcohol. He reports that he does not use drugs.  Additional Social History:  Alcohol / Drug Use Pain Medications: See PTA meds  Prescriptions: See PTA meds  Over the Counter: See PTA meds  History of alcohol / drug use?: Yes Substance #1 Name of Substance 1: Alcohol 1 - Age of First Use: 24 1 - Amount (size/oz): pt reports he goes on binges and drinks in excess  1 - Frequency: pt reports binges occur sporadically when he is stressed  1 - Duration: ongoing 1 - Last Use / Amount: 2 days ago  Substance #2 Name of Substance 2: Marijuana  2 - Age of First Use: 21 2 - Amount (size/oz): 1 blunt  2 - Frequency: recreational  2 - Duration: ongoing 2 - Last Use / Amount: 2 days ago   CIWA: CIWA-Ar BP: 130/90 Pulse Rate: 85 COWS:    PATIENT STRENGTHS: (choose at least two) Average or above average intelligence Capable of independent living Metallurgist fund of knowledge Motivation for treatment/growth Physical Health  Allergies: No Known Allergies  Home Medications:  (Not in a hospital admission)  OB/GYN Status:  No LMP for male patient.  General Assessment Data Location of Assessment: [facility] Assessment Services TTS Assessment: In system Is  this a Tele or Face-to-Face Assessment?: Face-to-Face Is this an Initial Assessment or a Re-assessment for this encounter?: Initial Assessment Marital status: Divorced Is patient pregnant?: No Pregnancy Status: No Living Arrangements: Non-relatives/Friends Can pt return to current living arrangement?: Yes Admission Status: Voluntary Is patient capable of signing voluntary admission?: Yes Referral Source: Self/Family/Friend Insurance type: Scientist, research (physical sciences)BCBS  Medical Screening Exam [facility] Walk-in ONLY) Medical Exam completed: Yes  Crisis Care Plan Living Arrangements: Non-relatives/Friends Name of Psychiatrist: none Name of Therapist: none  Education Status Is patient currently in school?:  No Highest grade of school patient has completed: some college   Risk to self with the past 6 months Suicidal Ideation: No Has patient been a risk to self within the past 6 months prior to admission? : No Suicidal Intent: No Has patient had any suicidal intent within the past 6 months prior to admission? : No Is patient at risk for suicide?: No Suicidal Plan?: No Has patient had any suicidal plan within the past 6 months prior to admission? : No Access to Means: No What has been your use of drugs/alcohol within the last 12 months?: reports to recreational marijuana and alcohol use  Previous Attempts/Gestures: Yes How many times?: 1 Triggers for Past Attempts: Family contact Intentional Self Injurious Behavior: None Family Suicide History: No Recent stressful life event(s): Divorce, Job Loss, Financial Problems Persecutory voices/beliefs?: No Depression: Yes Depression Symptoms: Isolating, Loss of interest in usual pleasures Substance abuse history and/or treatment for substance abuse?: No Suicide prevention information given to non-admitted patients: Not applicable  Risk to Others within the past 6 months Homicidal Ideation: No Does patient have any lifetime risk of violence toward others beyond the six months prior to admission? : No Thoughts of Harm to Others: No Current Homicidal Intent: No Current Homicidal Plan: No Access to Homicidal Means: No History of harm to others?: No Assessment of Violence: None Noted Does patient have access to weapons?: No Criminal Charges Pending?: No Does patient have a court date: No Is patient on probation?: No  Psychosis Hallucinations: Auditory (pt describes an "inner voice" telling him to do things ) Delusions: None noted  Mental Status Report Appearance/Hygiene: Unremarkable Eye Contact: Good Motor Activity: Freedom of movement Speech: Logical/coherent Level of Consciousness: Alert Mood: Anxious Affect: Anxious,  Apprehensive Anxiety Level: Moderate Thought Processes: Coherent, Relevant Judgement: Unimpaired Orientation: Place, Person, Time, Appropriate for developmental age, Situation Obsessive Compulsive Thoughts/Behaviors: None  Cognitive Functioning Concentration: Normal Memory: Recent Intact, Remote Intact IQ: Average Insight: Good Impulse Control: Good Appetite: Good Sleep: Increased Total Hours of Sleep: 12 Vegetative Symptoms: Staying in bed  ADLScreening [facility] Assessment Services) Patient's cognitive ability adequate to safely complete daily activities?: Yes Patient able to express need for assistance with ADLs?: Yes Independently performs ADLs?: Yes (appropriate for developmental age)  Prior Inpatient Therapy Prior Inpatient Therapy: Yes Prior Therapy Dates: 2016 [facility] Reason for Treatment: MDD, SI   Prior Outpatient Therapy Prior Outpatient Therapy: No Does patient have an ACCT team?: No Does patient have Intensive In-House Services?  : No Does patient have [facility] services? : No Does patient have P4CC services?: No  ADL Screening (condition at time of admission) Patient's cognitive ability adequate to safely complete daily activities?: Yes Is the patient deaf or have difficulty hearing?: No Does the patient have difficulty seeing, even when wearing glasses/contacts?: No Does the patient have difficulty concentrating, remembering, or making decisions?: No Patient able to express need for assistance with ADLs?: Yes Does the patient  have difficulty dressing or bathing?: No Independently performs ADLs?: Yes (appropriate for developmental age) Does the patient have difficulty walking or climbing stairs?: No Weakness of Legs: None Weakness of Arms/Hands: None  Home Assistive Devices/Equipment Home Assistive Devices/Equipment: None    Abuse/Neglect Assessment (Assessment to be complete while patient is alone) Physical Abuse:  Denies Verbal Abuse: Denies Sexual Abuse: Denies Exploitation of patient/patient's resources: Denies Self-Neglect: Denies     Merchant navy officer (For Healthcare) Does Patient Have a Medical Advance Directive?: No Would patient like information on creating a medical advance directive?: No - Patient declined    Additional Information 1:1 In Past 12 Months?: No CIRT Risk: No Elopement Risk: No Does patient have medical clearance?: Yes     Disposition:  Disposition Initial Assessment Completed for this Encounter: Yes Disposition of Patient: Other dispositions Other disposition(s): Other (Comment) (d/c w/ OPT resources )  Karolee Ohs 01/22/2017 10:42 PM

## 2017-03-06 ENCOUNTER — Emergency Department (HOSPITAL_COMMUNITY): Payer: Self-pay

## 2017-03-06 ENCOUNTER — Other Ambulatory Visit: Payer: Self-pay

## 2017-03-06 ENCOUNTER — Emergency Department (HOSPITAL_COMMUNITY)
Admission: EM | Admit: 2017-03-06 | Discharge: 2017-03-07 | Disposition: A | Discharge status: Home / Self Care | Payer: Self-pay | Attending: Emergency Medicine | Admitting: Emergency Medicine

## 2017-03-06 ENCOUNTER — Encounter (HOSPITAL_COMMUNITY): Payer: Self-pay | Admitting: Emergency Medicine

## 2017-03-06 DIAGNOSIS — M542 Cervicalgia: Secondary | ICD-10-CM | POA: Insufficient documentation

## 2017-03-06 DIAGNOSIS — W109XXA Fall (on) (from) unspecified stairs and steps, initial encounter: Secondary | ICD-10-CM | POA: Insufficient documentation

## 2017-03-06 DIAGNOSIS — W108XXA Fall (on) (from) other stairs and steps, initial encounter: Secondary | ICD-10-CM

## 2017-03-06 DIAGNOSIS — Y939 Activity, unspecified: Secondary | ICD-10-CM | POA: Insufficient documentation

## 2017-03-06 DIAGNOSIS — F17210 Nicotine dependence, cigarettes, uncomplicated: Secondary | ICD-10-CM | POA: Insufficient documentation

## 2017-03-06 DIAGNOSIS — Y999 Unspecified external cause status: Secondary | ICD-10-CM | POA: Insufficient documentation

## 2017-03-06 DIAGNOSIS — Y929 Unspecified place or not applicable: Secondary | ICD-10-CM | POA: Insufficient documentation

## 2017-03-06 DIAGNOSIS — S0101XA Laceration without foreign body of scalp, initial encounter: Secondary | ICD-10-CM | POA: Insufficient documentation

## 2017-03-06 DIAGNOSIS — K0889 Other specified disorders of teeth and supporting structures: Secondary | ICD-10-CM | POA: Insufficient documentation

## 2017-03-06 DIAGNOSIS — M7918 Myalgia, other site: Secondary | ICD-10-CM

## 2017-03-06 DIAGNOSIS — I951 Orthostatic hypotension: Secondary | ICD-10-CM

## 2017-03-06 DIAGNOSIS — R0781 Pleurodynia: Secondary | ICD-10-CM | POA: Insufficient documentation

## 2017-03-06 DIAGNOSIS — S060X9A Concussion with loss of consciousness of unspecified duration, initial encounter: Secondary | ICD-10-CM | POA: Insufficient documentation

## 2017-03-06 LAB — BASIC METABOLIC PANEL
ANION GAP: 11 (ref 5–15)
BUN: 9 mg/dL (ref 6–20)
CALCIUM: 9.2 mg/dL (ref 8.9–10.3)
CHLORIDE: 99 mmol/L — AB (ref 101–111)
CO2: 25 mmol/L (ref 22–32)
Creatinine, Ser: 1 mg/dL (ref 0.61–1.24)
GFR calc non Af Amer: >60 mL/min (ref >60)
Glucose, Bld: 99 mg/dL (ref 65–99)
Potassium: 3.9 mmol/L (ref 3.5–5.1)
Sodium: 135 mmol/L (ref 135–145)

## 2017-03-06 LAB — CBC
HCT: 44.9 % (ref 39.0–52.0)
HEMOGLOBIN: 15.6 g/dL (ref 13.0–17.0)
MCH: 30.6 pg (ref 26.0–34.0)
MCHC: 34.7 g/dL (ref 30.0–36.0)
MCV: 88 fL (ref 78.0–100.0)
Platelets: 280 10*3/uL (ref 150–400)
RBC: 5.1 MIL/uL (ref 4.22–5.81)
RDW: 13.1 % (ref 11.5–15.5)
WBC: 8.9 10*3/uL (ref 4.0–10.5)

## 2017-03-06 LAB — I-STAT TROPONIN, ED: TROPONIN I, POC: 0 ng/mL (ref 0.00–0.08)

## 2017-03-06 MED ORDER — METHOCARBAMOL 1000 MG/10ML IJ SOLN
1000.0000 mg | Freq: Once | INTRAVENOUS | Status: AC
  Administered 2017-03-06: 1000 mg via INTRAVENOUS
  Filled 2017-03-06: qty 10

## 2017-03-06 MED ORDER — SODIUM CHLORIDE 0.9 % IV BOLUS (SEPSIS)
1000.0000 mL | Freq: Once | INTRAVENOUS | Status: AC
  Administered 2017-03-06: 1000 mL via INTRAVENOUS

## 2017-03-06 NOTE — ED Notes (Signed)
Patient transported to CT 

## 2017-03-06 NOTE — ED Notes (Signed)
Pt denies chest pain at this time.

## 2017-03-06 NOTE — ED Triage Notes (Signed)
Patient arrives with complaint of chest pain, left arm numbness, and head injury. States he fell down a flight of steps on Wednesday. LOC for unknown amount of time. Was found by neighbor. Today patient began to experience some chest pain coupled with left arm numbness and pain. Currently chest pain is not present, but paresthesia is still there. Abrasion noted to posterior head. Patient states there was a pool of blood where he landed.

## 2017-03-06 NOTE — ED Provider Notes (Signed)
[facility]EM[facility]ENCY DEPT Provider Note   CSN: [id number] Arrival date & time: 03/06/17  2044     History   Chief Complaint Chief Complaint  Patient presents with  . Head Injury  . Chest Pain    HPI Leonard Martinez is a 46 y.o. male.  HPI   Pt with hx chronic neck and back pain, depression, HLD p/w 2 complaints.  He has had headache, lightheadedness, light sensitivity, period of amnesia following fall down the stairs 4 days ago.  He does not know how he fell down the stairs, he was going to work, did not have the lights on.  He woke up with a pool of blood under his head and his feet on the stairs.  Has pain in his bilateral ankles, left elbow, left chest that have developed since the fall.  Tetanus is UTD.  Reports left sided chest pain and left arm tingling that first occurred yesterday while sitting on the bus.  He was SOB while this was happening, lasted about 15 minutes.  Pain described as pressure.  The pain occurred again last night, waking him from sleep, lasting about 30 minutes and resolved.  No exacerbating or palliative symptoms.  He walked 1 mile to get to the hospital tonight and had no pain.    Pt does report some depression but denies SI, HI.  Denies heavy alcohol or drug use.    Past Medical History:  Diagnosis Date  . Chronic back pain   . Herniated disc   . Hyperlipidemia     Patient Active Problem List   Diagnosis Date Noted  . MDD (major depressive disorder), single episode, severe , no psychosis (HCC) 06/28/2015  . Cocaine use disorder, mild, abuse 06/28/2015  . Tobacco use disorder 06/28/2015  . Cannabis use disorder, severe, dependence (HCC) 06/28/2015  . Alcohol use disorder, moderate, in sustained remission (HCC) 06/28/2015  . Suicide attempt (HCC) 06/27/2015  . Psychoactive substance-induced mood disorder (HCC) 06/26/2015  . Salicylate overdose 06/25/2015    History reviewed. No pertinent surgical history.     Home Medications    Prior to  Admission medications   Medication Sig Start Date End Date Taking? Authorizing Provider  acetaminophen (TYLENOL) 500 MG tablet Take 1,000 mg by mouth every 6 (six) hours as needed for mild pain.   Yes Historical Provider, MD  methocarbamol (ROBAXIN) 500 MG tablet Take 1-2 tablets (500-1,000 mg total) by mouth every 6 (six) hours as needed. 03/07/17   Trixie Dredge, PA-C  penicillin v potassium (VEETID) 500 MG tablet Take 1 tablet (500 mg total) by mouth 4 (four) times daily. 03/07/17 03/14/17  Trixie Dredge, PA-C    Family History Family History  Problem Relation Age of Onset  . Bladder Cancer Mother   . Pancreatic cancer Father     Social History Social History  Substance Use Topics  . Smoking status: Current Every Day Smoker    Packs/day: 10.00    Types: Cigarettes  . Smokeless tobacco: Never Used  . Alcohol use Yes     Allergies   Patient has no known allergies.   Review of Systems Review of Systems  All other systems reviewed and are negative.    Physical Exam Updated Vital Signs BP 126/85   Pulse 72   Temp 97.7 F (36.5 C) (Oral)   Resp 16   SpO2 98%   Physical Exam  Constitutional: He appears well-developed and well-nourished. No distress.  HENT:  Head: Normocephalic.  Laceration, hemostatic, posterior scalp.  Mostly edentulous.  Tooth #11 with decay at base and tenderness to palpation.  No noted erythema or edema.    Eyes: Conjunctivae are normal.  Neck: Normal range of motion. Neck supple.  Cardiovascular: Normal rate and normal heart sounds.   Pulmonary/Chest: Effort normal and breath sounds normal. No respiratory distress. He has no wheezes. He has no rales. He exhibits tenderness.  Tenderness left posterior ribs, no skin changes.   Abdominal: Soft. He exhibits no distension and no mass. There is no tenderness. There is no rebound and no guarding.  Musculoskeletal: Normal range of motion. He exhibits no edema, tenderness or deformity.  Tenderness throughout  cervical spine.  NO tenderness of thoracic or lumbar spine.    Neurological: He is alert. He exhibits normal muscle tone.  CN II-XII intact, EOMs intact, no pronator drift, grip strengths equal bilaterally; strength 5/5 in all extremities, sensation intact in all extremities; finger to nose is normal.     Skin: He is not diaphoretic.  Psychiatric: He has a normal mood and affect.  Nursing note and vitals reviewed.   ED Treatments / Results  Labs (all labs ordered are listed, but only abnormal results are displayed) Labs Reviewed  BASIC METABOLIC PANEL - Abnormal; Notable for the following:       Result Value   Chloride 99 (*)    All other components within normal limits  CBC  I-STAT TROPOININ, ED  I-STAT TROPOININ, ED    EKG  EKG Interpretation  Date/Time:  Sunday March 06 2017 20:50:07 EDT Ventricular Rate:  103 PR Interval:  140 QRS Duration: 94 QT Interval:  338 QTC Calculation: 442 R Axis:   85 Text Interpretation:  Sinus tachycardia Otherwise normal ECG No significant change vs comparison. Confirmed by Fayrene Fearing  MD, MARK ([id number]) on 03/06/2017 8:55:31 PM       Radiology Dg Chest 2 View  Result Date: 03/06/2017 CLINICAL DATA:  Larey Seat 5 days ago. Now with 1 day history of upper left chest pain and shoulder pain, persisting along with some paresthesias. EXAM: CHEST  2 VIEW COMPARISON:  None. FINDINGS: The heart size and mediastinal contours are within normal limits. Both lungs are clear. The visualized skeletal structures are unremarkable. IMPRESSION: No active cardiopulmonary disease. Electronically Signed   By: Ellery Plunk M.D.   On: 03/06/2017 21:32   Ct Head Wo Contrast  Result Date: 03/07/2017 CLINICAL DATA:  Larey Seat down the stairs 4 days ago with increasing headache EXAM: CT HEAD WITHOUT CONTRAST CT CERVICAL SPINE WITHOUT CONTRAST TECHNIQUE: Multidetector CT imaging of the head and cervical spine was performed following the standard protocol without intravenous  contrast. Multiplanar CT image reconstructions of the cervical spine were also generated. COMPARISON:  None. FINDINGS: CT HEAD FINDINGS Brain: No acute territorial infarction, hemorrhage or intracranial mass is visualized. Retro cerebellar CSF density may relate to retro cerebellar arachnoid cyst or enlarged cisterna magna. Ventricle size within normal limits. Vascular: No hyperdense vessels. Scattered calcifications at the carotid siphon Skull: No skull fracture.  No suspicious bone lesion. Sinuses/Orbits: Minimal mucosal thickening in the maxillary and ethmoid sinuses. No acute orbital abnormality. Other: None CT CERVICAL SPINE FINDINGS Alignment: Straightening of the cervical spine. No subluxation. Facet alignment is within normal limits. Skull base and vertebrae: Craniovertebral junction appears. No fracture is visualized. Vertebral body heights are within normal limits. Soft tissues and spinal canal: No prevertebral fluid or swelling. No visible canal hematoma. Disc levels: Moderate narrowing at C4-C5 with mild to moderate narrowing at C5-C6.  Posterior disc osteophyte complex at C4-C5 and C5-C6. Multilevel bilateral foraminal stenosis. Upper chest: Lung apices are clear. Thyroid gland is within normal limits. Other: None IMPRESSION: 1. No definite CT evidence for acute intracranial abnormality. 2. Straightening of the cervical spine. No definite acute fracture or malalignment. Electronically Signed   By: Jasmine Pang M.D.   On: 03/07/2017 00:03   Ct Cervical Spine Wo Contrast  Result Date: 03/07/2017 CLINICAL DATA:  Larey Seat down the stairs 4 days ago with increasing headache EXAM: CT HEAD WITHOUT CONTRAST CT CERVICAL SPINE WITHOUT CONTRAST TECHNIQUE: Multidetector CT imaging of the head and cervical spine was performed following the standard protocol without intravenous contrast. Multiplanar CT image reconstructions of the cervical spine were also generated. COMPARISON:  None. FINDINGS: CT HEAD FINDINGS  Brain: No acute territorial infarction, hemorrhage or intracranial mass is visualized. Retro cerebellar CSF density may relate to retro cerebellar arachnoid cyst or enlarged cisterna magna. Ventricle size within normal limits. Vascular: No hyperdense vessels. Scattered calcifications at the carotid siphon Skull: No skull fracture.  No suspicious bone lesion. Sinuses/Orbits: Minimal mucosal thickening in the maxillary and ethmoid sinuses. No acute orbital abnormality. Other: None CT CERVICAL SPINE FINDINGS Alignment: Straightening of the cervical spine. No subluxation. Facet alignment is within normal limits. Skull base and vertebrae: Craniovertebral junction appears. No fracture is visualized. Vertebral body heights are within normal limits. Soft tissues and spinal canal: No prevertebral fluid or swelling. No visible canal hematoma. Disc levels: Moderate narrowing at C4-C5 with mild to moderate narrowing at C5-C6. Posterior disc osteophyte complex at C4-C5 and C5-C6. Multilevel bilateral foraminal stenosis. Upper chest: Lung apices are clear. Thyroid gland is within normal limits. Other: None IMPRESSION: 1. No definite CT evidence for acute intracranial abnormality. 2. Straightening of the cervical spine. No definite acute fracture or malalignment. Electronically Signed   By: Jasmine Pang M.D.   On: 03/07/2017 00:03    Procedures Procedures (including critical care time)  Medications Ordered in ED Medications  methocarbamol (ROBAXIN) 1,000 mg in dextrose 5 % 50 mL IVPB (0 mg Intravenous Stopped 03/06/17 2330)  sodium chloride 0.9 % bolus 1,000 mL (0 mLs Intravenous Stopped 03/07/17 0003)  ketorolac (TORADOL) 30 MG/ML injection 30 mg (30 mg Intravenous Given 03/07/17 0039)     Initial Impression / Assessment and Plan / ED Course  I have reviewed the triage vital signs and the nursing notes.  Pertinent labs & imaging results that were available during my care of the patient were reviewed by me and  considered in my medical decision making (see chart for details).  Clinical Course as of Mar 07 117  Mon Mar 07, 2017  0024 Tetanus UTD  [EW]    Clinical Course User Index [EW] Trixie Dredge, PA-C    Afebrile nontoxic patient with fall down stairs 4 days ago with resulting concussion and body aches.  No focal bony tenderness concerning for fracture with exception of neck and posterior left ribs.  CXR negative.  CT head, c-spine negative.  Pt does have wound on the posterior scalp that does not appear infected.  Has had dental pain in addition to postconcussive symptoms and is therefore not eating or drinking much, has been on the couch with this concussion - this is likely why he is feeling somewhat lightheaded and has orthostasis.  Chest pain today is atypical, likely related to moving around after this significant fall.  Troponins x 2 negative,  EKG nonischemic.  Pt d/c home with referral to new concussion  clinic with Dr Antoine Primas, dental follow up, PCP follow up, robaxin, penicillin.  Discussed result, findings, treatment, and follow up  with patient.  Pt given return precautions.  Pt verbalizes understanding and agrees with plan.      Final Clinical Impressions(s) / ED Diagnoses   Final diagnoses:  Fall down stairs, initial encounter  Scalp laceration, initial encounter  Musculoskeletal pain  Concussion with loss of consciousness, initial encounter  Orthostasis  Pain, dental    New Prescriptions New Prescriptions   METHOCARBAMOL (ROBAXIN) 500 MG TABLET    Take 1-2 tablets (500-1,000 mg total) by mouth every 6 (six) hours as needed.   PENICILLIN V POTASSIUM (VEETID) 500 MG TABLET    Take 1 tablet (500 mg total) by mouth 4 (four) times daily.     Trixie Dredge, PA-C 03/07/17 0120    Mancel Bale, MD 03/07/17 [phone]

## 2017-03-06 NOTE — ED Notes (Signed)
Patient transported to X-ray 

## 2017-03-07 LAB — I-STAT TROPONIN, ED: TROPONIN I, POC: 0 ng/mL (ref 0.00–0.08)

## 2017-03-07 MED ORDER — KETOROLAC TROMETHAMINE 30 MG/ML IJ SOLN
30.0000 mg | Freq: Once | INTRAMUSCULAR | Status: AC
  Administered 2017-03-07: 30 mg via INTRAVENOUS
  Filled 2017-03-07: qty 1

## 2017-03-07 MED ORDER — METHOCARBAMOL 500 MG PO TABS: 500.0000 mg | ORAL_TABLET | Freq: Four times a day (QID) | ORAL | 0 refills | Status: DC | PRN

## 2017-03-07 MED ORDER — PENICILLIN V POTASSIUM 500 MG PO TABS: 500.0000 mg | ORAL_TABLET | Freq: Four times a day (QID) | ORAL | 0 refills | Status: DC

## 2017-03-07 NOTE — Discharge Instructions (Signed)
Read the information below.  Use the prescribed medication as directed.  Please discuss all new medications with your pharmacist.  You may return to the Emergency Department at any time for worsening condition or any new symptoms that concern you.    Please call the dentist listed above within 48 hours to schedule a close follow up appointment.  If you develop fevers, swelling in your face, difficulty swallowing or breathing, return to the ER immediately for a recheck.    You have had a head injury which does not appear to require admission at this time. A concussion is a state of changed mental ability from trauma. SEEK IMMEDIATE MEDICAL ATTENTION IF: There is confusion or drowsiness (although children frequently become drowsy after injury).  You cannot awaken the injured person.  There is nausea (feeling sick to your stomach) or continued, forceful vomiting.  You notice dizziness or unsteadiness which is getting worse, or inability to walk.  You have convulsions or unconsciousness.  You experience severe, persistent headaches not relieved by Tylenol?. (Do not take aspirin as this impairs clotting abilities). Take other pain medications only as directed.  You cannot use arms or legs normally.  There are changes in pupil sizes. (This is the black center in the colored part of the eye)  There is clear or bloody discharge from the nose or ears.  Change in speech, vision, swallowing, or understanding.  Localized weakness, numbness, tingling, or change in bowel or bladder control.

## 2017-03-07 NOTE — ED Notes (Signed)
Pt ambulated to the bathroom without difficulty.  

## 2017-03-11 ENCOUNTER — Encounter (HOSPITAL_COMMUNITY): Payer: Self-pay | Admitting: *Deleted

## 2017-03-11 ENCOUNTER — Emergency Department (HOSPITAL_COMMUNITY)
Admission: EM | Admit: 2017-03-11 | Discharge: 2017-03-12 | Disposition: A | Discharge status: Other Acute Inpatient Hospital | Payer: No Typology Code available for payment source | Attending: Emergency Medicine | Admitting: Emergency Medicine

## 2017-03-11 DIAGNOSIS — R440 Auditory hallucinations: Secondary | ICD-10-CM | POA: Insufficient documentation

## 2017-03-11 DIAGNOSIS — F1910 Other psychoactive substance abuse, uncomplicated: Secondary | ICD-10-CM | POA: Insufficient documentation

## 2017-03-11 DIAGNOSIS — Z79899 Other long term (current) drug therapy: Secondary | ICD-10-CM | POA: Insufficient documentation

## 2017-03-11 DIAGNOSIS — R45851 Suicidal ideations: Secondary | ICD-10-CM | POA: Insufficient documentation

## 2017-03-11 DIAGNOSIS — R45850 Homicidal ideations: Secondary | ICD-10-CM | POA: Insufficient documentation

## 2017-03-11 DIAGNOSIS — F17210 Nicotine dependence, cigarettes, uncomplicated: Secondary | ICD-10-CM | POA: Insufficient documentation

## 2017-03-11 LAB — CBC WITH DIFFERENTIAL/PLATELET
BASOS PCT: 1 %
Basophils Absolute: 0 10*3/uL (ref 0.0–0.1)
EOS ABS: 0.2 10*3/uL (ref 0.0–0.7)
Eosinophils Relative: 4 %
HCT: 40.7 % (ref 39.0–52.0)
HEMOGLOBIN: 14.3 g/dL (ref 13.0–17.0)
LYMPHS PCT: 29 %
Lymphs Abs: 1.5 10*3/uL (ref 0.7–4.0)
MCH: 30.8 pg (ref 26.0–34.0)
MCHC: 35.1 g/dL (ref 30.0–36.0)
MCV: 87.5 fL (ref 78.0–100.0)
MONOS PCT: 15 %
Monocytes Absolute: 0.8 10*3/uL (ref 0.1–1.0)
NEUTROS ABS: 2.7 10*3/uL (ref 1.7–7.7)
Neutrophils Relative %: 51 %
Platelets: 263 10*3/uL (ref 150–400)
RBC: 4.65 MIL/uL (ref 4.22–5.81)
RDW: 13 % (ref 11.5–15.5)
WBC: 5.1 10*3/uL (ref 4.0–10.5)

## 2017-03-11 LAB — COMPREHENSIVE METABOLIC PANEL
ALBUMIN: 3.8 g/dL (ref 3.5–5.0)
ALT: 20 U/L (ref 17–63)
ANION GAP: 11 (ref 5–15)
AST: 24 U/L (ref 15–41)
Alkaline Phosphatase: 83 U/L (ref 38–126)
BUN: <5 mg/dL — ABNORMAL LOW (ref 6–20)
CALCIUM: 9.1 mg/dL (ref 8.9–10.3)
CO2: 25 mmol/L (ref 22–32)
Chloride: 102 mmol/L (ref 101–111)
Creatinine, Ser: 0.92 mg/dL (ref 0.61–1.24)
GFR calc Af Amer: >60 mL/min (ref >60)
GFR calc non Af Amer: >60 mL/min (ref >60)
GLUCOSE: 110 mg/dL — AB (ref 65–99)
POTASSIUM: 3.8 mmol/L (ref 3.5–5.1)
SODIUM: 138 mmol/L (ref 135–145)
Total Bilirubin: 0.7 mg/dL (ref 0.3–1.2)
Total Protein: 6.9 g/dL (ref 6.5–8.1)

## 2017-03-11 LAB — I-STAT CHEM 8, ED
BUN: 6 mg/dL (ref 6–20)
CREATININE: 0.9 mg/dL (ref 0.61–1.24)
Calcium, Ion: 1.06 mmol/L — ABNORMAL LOW (ref 1.15–1.40)
Chloride: 104 mmol/L (ref 101–111)
GLUCOSE: 107 mg/dL — AB (ref 65–99)
HCT: 42 % (ref 39.0–52.0)
HEMOGLOBIN: 14.3 g/dL (ref 13.0–17.0)
Potassium: 3.8 mmol/L (ref 3.5–5.1)
Sodium: 138 mmol/L (ref 135–145)
TCO2: 26 mmol/L (ref 0–100)

## 2017-03-11 LAB — RAPID URINE DRUG SCREEN, HOSP PERFORMED
AMPHETAMINES: NOT DETECTED
BENZODIAZEPINES: NOT DETECTED
Barbiturates: NOT DETECTED
COCAINE: POSITIVE — AB
OPIATES: NOT DETECTED
TETRAHYDROCANNABINOL: POSITIVE — AB

## 2017-03-11 MED ORDER — ONDANSETRON HCL 4 MG PO TABS: 4.0000 mg | ORAL_TABLET | Freq: Three times a day (TID) | ORAL | Status: DC | PRN

## 2017-03-11 MED ORDER — ALUM & MAG HYDROXIDE-SIMETH 200-200-20 MG/5ML PO SUSP: 30.0000 mL | ORAL | Status: DC | PRN

## 2017-03-11 MED ORDER — ACETAMINOPHEN 325 MG PO TABS: 650.0000 mg | ORAL_TABLET | ORAL | Status: DC | PRN

## 2017-03-11 MED ORDER — IBUPROFEN 200 MG PO TABS: 600.0000 mg | ORAL_TABLET | Freq: Three times a day (TID) | ORAL | Status: DC | PRN

## 2017-03-11 NOTE — ED Triage Notes (Signed)
To ED for eval and treatment of SI thoughts. States he is hearing voices. States he tried to harm self last week but 'putting himself in different situations'. Pt is tearful. Pt states he has an ex-wife and kids local but doesn't see them.

## 2017-03-11 NOTE — ED Provider Notes (Signed)
[facility] Provider Note   CSN: [id number] Arrival date & time: 03/11/17  1808     History   Chief Complaint Chief Complaint  Patient presents with  . Medical Clearance    HPI Leonard Martinez is a 46 y.o. male.  HPI   Patient is a 46 year old male with history of hyperlipidemia and chronic back pain who presents to the ED with complaint of SI. Patient reports since leaving his wife and kids 2 years ago feeling like his life is "a mess". He notes over the past year he has been drinking and using drugs more frequently. He reports over the past few months he has had worsening auditory hallucinations and reports hearing voices that are telling him to hurt himself or do things he does not want to do. He also endorses having suicide ideation with multiple plan including walking out in front of a car, jumping off a bridge or hanging himself. Denies any recent attempts. He also endorses having homicidal ideations and reports when he is walking in the street he wants to do things 2 cars driving by or people walking passed them on the street. He reports having pain all over but states he has been walking long distances all week which she attributes to his pain.  Past Medical History:  Diagnosis Date  . Chronic back pain   . Herniated disc   . Hyperlipidemia     Patient Active Problem List   Diagnosis Date Noted  . MDD (major depressive disorder), single episode, severe , no psychosis (HCC) 06/28/2015  . Cocaine use disorder, mild, abuse 06/28/2015  . Tobacco use disorder 06/28/2015  . Cannabis use disorder, severe, dependence (HCC) 06/28/2015  . Alcohol use disorder, moderate, in sustained remission (HCC) 06/28/2015  . Suicide attempt (HCC) 06/27/2015  . Psychoactive substance-induced mood disorder (HCC) 06/26/2015  . Salicylate overdose 06/25/2015    History reviewed. No pertinent surgical history.     Home Medications    Prior to Admission medications   Medication Sig  Start Date End Date Taking? Authorizing Provider  acetaminophen (TYLENOL) 500 MG tablet Take 1,000 mg by mouth every 6 (six) hours as needed for mild pain.   Yes [provider]  methocarbamol (ROBAXIN) 500 MG tablet Take 1-2 tablets (500-1,000 mg total) by mouth every 6 (six) hours as needed. 03/07/17  Yes West, Emily, PA-C  penicillin v potassium (VEETID) 500 MG tablet Take 1 tablet (500 mg total) by mouth 4 (four) times daily. 03/07/17 03/14/17 Yes Trixie Dredge, PA-C    Family History Family History  Problem Relation Age of Onset  . Bladder Cancer Mother   . Pancreatic cancer Father     Social History Social History  Substance Use Topics  . Smoking status: Current Every Day Smoker    Packs/day: 10.00    Types: Cigarettes  . Smokeless tobacco: Never Used  . Alcohol use Yes     Allergies   Patient has no known allergies.   Review of Systems Review of Systems  Psychiatric/Behavioral: Positive for hallucinations and suicidal ideas.  All other systems reviewed and are negative.    Physical Exam Updated Vital Signs BP 118/78 (BP Location: Left Arm)   Pulse (!) 104   Temp 98.4 F (36.9 C) (Oral)   Resp 18   Ht 5\' 7"  (1.702 m)   Wt 79.4 kg   SpO2 96%   BMI 27.41 kg/m   Physical Exam  Constitutional: He is oriented to person, place, and time. He  appears well-developed and well-nourished. No distress.  HENT:  Head: Normocephalic and atraumatic.  Mouth/Throat: Oropharynx is clear and moist. No oropharyngeal exudate.  Eyes: Conjunctivae and EOM are normal. Pupils are equal, round, and reactive to light. Right eye exhibits no discharge. Left eye exhibits no discharge. No scleral icterus.  Neck: Normal range of motion. Neck supple.  Cardiovascular: Normal rate, regular rhythm, normal heart sounds and intact distal pulses.   HR 86  Pulmonary/Chest: Effort normal and breath sounds normal. No respiratory distress. He has no wheezes. He has no rales. He exhibits no  tenderness.  Abdominal: Soft. Bowel sounds are normal. He exhibits no distension and no mass. There is no tenderness. There is no rebound and no guarding.  Musculoskeletal: Normal range of motion. He exhibits no edema, tenderness or deformity.  No midline C, T, or L tenderness. Full range of motion of neck and back. Full range of motion of bilateral upper and lower extremities, with 5/5 strength. Sensation intact. 2+ radial and PT pulses. Cap refill <2 seconds.   Neurological: He is alert and oriented to person, place, and time.  Skin: Skin is warm and dry. He is not diaphoretic.  Psychiatric: His speech is normal. He is withdrawn. Cognition and memory are normal. He expresses inappropriate judgment. He exhibits a depressed mood. He expresses homicidal and suicidal ideation. He expresses suicidal plans.  Nursing note and vitals reviewed.    ED Treatments / Results  Labs (all labs ordered are listed, but only abnormal results are displayed) Labs Reviewed  COMPREHENSIVE METABOLIC PANEL - Abnormal; Notable for the following:       Result Value   Glucose, Bld 110 (*)    BUN <5 (*)    All other components within normal limits  RAPID URINE DRUG SCREEN, HOSP PERFORMED - Abnormal; Notable for the following:    Cocaine POSITIVE (*)    Tetrahydrocannabinol POSITIVE (*)    All other components within normal limits  I-STAT CHEM 8, ED - Abnormal; Notable for the following:    Glucose, Bld 107 (*)    Calcium, Ion 1.06 (*)    All other components within normal limits  CBC WITH DIFFERENTIAL/PLATELET  ETHANOL  SALICYLATE LEVEL  ACETAMINOPHEN LEVEL    EKG  EKG Interpretation None       Radiology No results found.  Procedures Procedures (including critical care time)  Medications Ordered in ED Medications  alum & mag hydroxide-simeth (MAALOX/MYLANTA) 200-200-20 MG/5ML suspension 30 mL (not administered)  ondansetron (ZOFRAN) tablet 4 mg (not administered)  ibuprofen (ADVIL,MOTRIN)  tablet 600 mg (not administered)  acetaminophen (TYLENOL) tablet 650 mg (not administered)     Initial Impression / Assessment and Plan / ED Course  I have reviewed the triage vital signs and the nursing notes.  Pertinent labs & imaging results that were available during my care of the patient were reviewed by me and considered in my medical decision making (see chart for details).     Patient presents with SI, HI and auditory hallucinations for the past few months which have worsened over the past few days. Denies any suicide attempt. Endorses drinking alcohol and using cocaine and Molly this week. VSS. On exam patient with depressed mood, withdrawn behavior, active suicide ideation and impaired judgment. Remaining exam unremarkable. Patient medically cleared in the ED. Consulted TTS.   Final Clinical Impressions(s) / ED Diagnoses   Final diagnoses:  Suicidal ideation  Drug abuse  Homicidal ideation  Auditory hallucinations    New Prescriptions  New Prescriptions   No medications on file     Gilford Rileadeau, Nicole Elizabeth, PA-C 03/12/17 [id number]    Rancour, Stephen, MD 03/12/17 [phone]

## 2017-03-11 NOTE — ED Notes (Signed)
Pt unable to void at this time. 

## 2017-03-12 ENCOUNTER — Encounter (HOSPITAL_COMMUNITY): Payer: Self-pay | Admitting: General Practice

## 2017-03-12 ENCOUNTER — Inpatient Hospital Stay (HOSPITAL_COMMUNITY)
Admission: AD | Admit: 2017-03-12 | Discharge: 2017-03-24 | DRG: 885 | Disposition: A | Discharge status: Home / Self Care | Payer: Medicaid Other | Source: Intra-hospital | Attending: Psychiatry | Admitting: Psychiatry

## 2017-03-12 DIAGNOSIS — W57XXXA Bitten or stung by nonvenomous insect and other nonvenomous arthropods, initial encounter: Secondary | ICD-10-CM | POA: Diagnosis present

## 2017-03-12 DIAGNOSIS — G8929 Other chronic pain: Secondary | ICD-10-CM | POA: Diagnosis present

## 2017-03-12 DIAGNOSIS — S80862A Insect bite (nonvenomous), left lower leg, initial encounter: Secondary | ICD-10-CM | POA: Diagnosis present

## 2017-03-12 DIAGNOSIS — F333 Major depressive disorder, recurrent, severe with psychotic symptoms: Principal | ICD-10-CM | POA: Diagnosis present

## 2017-03-12 DIAGNOSIS — F1410 Cocaine abuse, uncomplicated: Secondary | ICD-10-CM | POA: Diagnosis present

## 2017-03-12 DIAGNOSIS — G4700 Insomnia, unspecified: Secondary | ICD-10-CM | POA: Diagnosis present

## 2017-03-12 DIAGNOSIS — Z23 Encounter for immunization: Secondary | ICD-10-CM | POA: Diagnosis not present

## 2017-03-12 DIAGNOSIS — F1994 Other psychoactive substance use, unspecified with psychoactive substance-induced mood disorder: Secondary | ICD-10-CM

## 2017-03-12 DIAGNOSIS — F1914 Other psychoactive substance abuse with psychoactive substance-induced mood disorder: Secondary | ICD-10-CM | POA: Diagnosis present

## 2017-03-12 DIAGNOSIS — F1099 Alcohol use, unspecified with unspecified alcohol-induced disorder: Secondary | ICD-10-CM

## 2017-03-12 DIAGNOSIS — F332 Major depressive disorder, recurrent severe without psychotic features: Secondary | ICD-10-CM | POA: Diagnosis not present

## 2017-03-12 DIAGNOSIS — F1990 Other psychoactive substance use, unspecified, uncomplicated: Secondary | ICD-10-CM | POA: Diagnosis not present

## 2017-03-12 DIAGNOSIS — R45851 Suicidal ideations: Secondary | ICD-10-CM | POA: Diagnosis present

## 2017-03-12 DIAGNOSIS — Z5900 Homelessness unspecified: Secondary | ICD-10-CM

## 2017-03-12 DIAGNOSIS — F1290 Cannabis use, unspecified, uncomplicated: Secondary | ICD-10-CM | POA: Diagnosis not present

## 2017-03-12 DIAGNOSIS — F17210 Nicotine dependence, cigarettes, uncomplicated: Secondary | ICD-10-CM | POA: Diagnosis present

## 2017-03-12 DIAGNOSIS — F19959 Other psychoactive substance use, unspecified with psychoactive substance-induced psychotic disorder, unspecified: Secondary | ICD-10-CM | POA: Diagnosis present

## 2017-03-12 HISTORY — DX: Anxiety disorder, unspecified: F41.9

## 2017-03-12 LAB — ACETAMINOPHEN LEVEL

## 2017-03-12 LAB — SALICYLATE LEVEL: Salicylate Lvl: <7 mg/dL (ref 2.8–30.0)

## 2017-03-12 MED ORDER — MAGNESIUM HYDROXIDE 400 MG/5ML PO SUSP: 30.0000 mL | Freq: Every day | ORAL | Status: DC | PRN

## 2017-03-12 MED ORDER — PENICILLIN V POTASSIUM 500 MG PO TABS
500.0000 mg | ORAL_TABLET | Freq: Four times a day (QID) | ORAL | Status: AC
  Administered 2017-03-12 – 2017-03-18 (×24): 500 mg via ORAL
  Filled 2017-03-12: qty 2
  Filled 2017-03-12 (×28): qty 1

## 2017-03-12 MED ORDER — NICOTINE 21 MG/24HR TD PT24
21.0000 mg | MEDICATED_PATCH | Freq: Every day | TRANSDERMAL | Status: DC
  Administered 2017-03-12 – 2017-03-16 (×5): 21 mg via TRANSDERMAL
  Filled 2017-03-12 (×6): qty 1

## 2017-03-12 MED ORDER — ACETAMINOPHEN 325 MG PO TABS
650.0000 mg | ORAL_TABLET | Freq: Four times a day (QID) | ORAL | Status: DC | PRN
  Administered 2017-03-13 – 2017-03-24 (×20): 650 mg via ORAL
  Filled 2017-03-12 (×22): qty 2

## 2017-03-12 MED ORDER — HYDROXYZINE HCL 25 MG PO TABS
25.0000 mg | ORAL_TABLET | Freq: Three times a day (TID) | ORAL | Status: DC | PRN
  Administered 2017-03-12 – 2017-03-23 (×25): 25 mg via ORAL
  Filled 2017-03-12 (×26): qty 1

## 2017-03-12 MED ORDER — ENSURE ENLIVE PO LIQD: 237.0000 mL | Freq: Two times a day (BID) | ORAL | Status: DC

## 2017-03-12 MED ORDER — SERTRALINE HCL 25 MG PO TABS
25.0000 mg | ORAL_TABLET | Freq: Every day | ORAL | Status: DC
  Administered 2017-03-12 – 2017-03-21 (×10): 25 mg via ORAL
  Filled 2017-03-12 (×12): qty 1

## 2017-03-12 MED ORDER — MELOXICAM 7.5 MG PO TABS
7.5000 mg | ORAL_TABLET | Freq: Every day | ORAL | Status: DC
  Administered 2017-03-12 – 2017-03-14 (×3): 7.5 mg via ORAL
  Filled 2017-03-12 (×5): qty 1

## 2017-03-12 MED ORDER — RAMELTEON 8 MG PO TABS
8.0000 mg | ORAL_TABLET | Freq: Every day | ORAL | Status: DC
  Administered 2017-03-12 – 2017-03-13 (×2): 8 mg via ORAL
  Filled 2017-03-12 (×4): qty 1

## 2017-03-12 MED ORDER — ALUM & MAG HYDROXIDE-SIMETH 200-200-20 MG/5ML PO SUSP: 30.0000 mL | ORAL | Status: DC | PRN

## 2017-03-12 MED ORDER — PNEUMOCOCCAL VAC POLYVALENT 25 MCG/0.5ML IJ INJ
0.5000 mL | INJECTION | INTRAMUSCULAR | Status: AC
  Administered 2017-03-13: 0.5 mL via INTRAMUSCULAR

## 2017-03-12 NOTE — [facility] Suicide Risk Assessment (Signed)
[facility] Admission Suicide Risk Assessment   Nursing information obtained from:    Demographic factors:    Current Mental Status:    Loss Factors:    Historical Factors:    Risk Reduction Factors:     Total Time spent with patient: 45 minutes Principal Problem: MDD (major depressive disorder), recurrent severe, without psychosis (HCC) Diagnosis:   Patient Active Problem List   Diagnosis Date Noted  . Homelessness [Z59.0] 03/12/2017  . MDD (major depressive disorder), recurrent severe, without psychosis (HCC) [F33.2] 03/12/2017  . MDD (major depressive disorder), single episode, severe , no psychosis (HCC) [F32.2] 06/28/2015  . Cocaine use disorder, mild, abuse [F14.10] 06/28/2015  . Tobacco use disorder [F17.200] 06/28/2015  . Cannabis use disorder, severe, dependence (HCC) [F12.20] 06/28/2015  . Alcohol use disorder, moderate, in sustained remission (HCC) [F10.21] 06/28/2015  . Suicide attempt (HCC) [T14.91XA] 06/27/2015  . Substance induced mood disorder (HCC) [F19.94] 06/26/2015  . Salicylate overdose [T39.091A] 06/25/2015   Subjective Data: patient is 46 year old Caucasian man who was admitted to the behavioral  Health center because of increased depression, having suicidal thoughts and plan to jump off from the bridge.  He is also using drugs and alcohol.  He is experiencing hallucination and feeling paranoid.  Please see history and physical for more detailed information.  Continued Clinical Symptoms:  Alcohol Use Disorder Identification Test Final Score (AUDIT): 35 The "Alcohol Use Disorders Identification Test", Guidelines for Use in Primary Care, Second Edition.  World Science writer [facility]). Score between 0-7:  no or low risk or alcohol related problems. Score between 8-15:  moderate risk of alcohol related problems. Score between 16-19:  high risk of alcohol related problems. Score 20 or above:  warrants further diagnostic evaluation for alcohol dependence and  treatment.   CLINICAL FACTORS:   Depression:   Anhedonia Comorbid alcohol abuse/dependence Hopelessness Impulsivity Insomnia Recent sense of peace/wellbeing Severe Alcohol/Substance Abuse/Dependencies More than one psychiatric diagnosis Unstable or Poor Therapeutic Relationship Previous Psychiatric Diagnoses and Treatments   Musculoskeletal: Strength & Muscle Tone: within normal limits Gait & Station: normal Patient leans: N/A  Psychiatric Specialty Exam: Physical Exam  ROS  Blood pressure 100/62, pulse 90, temperature 98.5 F (36.9 C), temperature source Oral, resp. rate 18, height 5\' 8"  (1.727 m), weight 76.7 kg (169 lb).Body mass index is 25.7 kg/m.  General Appearance: Disheveled  Eye Contact:  Minimal  Speech:  Slow  Volume:  Decreased  Mood:  Depressed, Dysphoric, Hopeless and Irritable  Affect:  Constricted and Depressed  Thought Process:  Descriptions of Associations: Circumstantial  Orientation:  Full (Time, Place, and Person)  Thought Content:  Hallucinations: Auditory, Paranoid Ideation and Rumination  Suicidal Thoughts:  Yes.  with intent/plan  Homicidal Thoughts:  No  Memory:  Immediate;   Fair Recent;   Fair Remote;   Fair  Judgement:  Fair  Insight:  Fair  Psychomotor Activity:  Decreased  Concentration:  Concentration: Fair and Attention Span: Fair  Recall:  Good  Fund of Knowledge:  Good  Language:  Good  Akathisia:  No  Handed:  Right  AIMS (if indicated):     Assets:  Desire for Improvement  ADL's:  Intact  Cognition:  WNL  Sleep:         COGNITIVE FEATURES THAT CONTRIBUTE TO RISK:  Closed-mindedness, Loss of executive function, Polarized thinking and Thought constriction (tunnel vision)    SUICIDE RISK:   Moderate:  Frequent suicidal ideation with limited intensity, and duration, some specificity in terms of  plans, no associated intent, good self-control, limited dysphoria/symptomatology, some risk factors present, and identifiable  protective factors, including available and accessible social support.  PLAN OF CARE: admitted to behavioral [facility] stabilization.  Restart medication.  Please see history and physical  For more information and land of care.  I certify that inpatient services furnished can reasonably be expected to improve the patient's condition.   Corbet Hanley T., MD 03/12/2017, 12:58 PM

## 2017-03-12 NOTE — Progress Notes (Signed)
Admission Note:  D70- 45 y.o. male who presents voluntary,for the treatment of depression, suicidality, ETOH dependence and polysubstance abuse. Patient appears sad, tearful, depressed and hopeless on admission. Reports AH of voices pushing him to do things like fight or kill himself (always in context of drug abuse) and also shadows that look like people but who do not exist when he approaches them. He has suicidal thoughts of running into traffic or jumping off a bridge into traffic. He states that he is tired of being a disappointment to everyone including his children; is glad that his mother isn't alive to see what he has become, and sometimes wishfully thinks that perhaps they could be reunited in death. Patient OD'd on aspirin and beer 2 years ago and was hospitalized. Patient was sober in NA/AA for 12 years, says that he did quite well, had a well paying job, a home and 2 cars, but he lost all of that when he began drinking again and his wife divorced him. Bitter about the divorce. Patient was cooperative with admission process.   A- Skin was assessed and found to be clear of any abnormal marks other than tattoos and one blister on right middle toe. C/o knee pain r/t walking constantly for a week and has some limited mobility due to pain.   Patient searched and no contraband found, POC and unit policies explained and understanding verbalized. Consents obtained.  .  R- Patient had no additional questions or concerns. Safety maintained on unit.

## 2017-03-12 NOTE — ED Notes (Signed)
Notified [facility] for transportation to [facility] 

## 2017-03-12 NOTE — BH Assessment (Addendum)
Tele Assessment Note   Leonard Martinez is an 46 y.o. divorced male who presents unaccompanied to [facility] ED reporting substance abuse and depressive symptoms, including suicidal ideation. Pt reports he is using various substances, "anything I can." He says his life is "a mess" since he divorce from his wife two years ago. He reports a long history of using alcohol and various drugs and states recently he has been using alcohol, cocaine, marijuana and MDMA. He says he has used meth in the past but he doesn't like it. He says he doesn't use as often as he would like because he cannot afford it. He says when he is high he hears auditory hallucinations telling him to "end it all". Pt reports current suicidal ideation with plan to either jump from an overpass, run into traffic or hang himself. Pt reports he attempted suicide in August 2016 by overdosing on 100 tabs of aspirin and was in ICU before being transferred to [facility]. Pt denies current homicidal ideation but says he has thoughts of harming people when he is intoxicated. He denies any history of violence. Pt denies visual hallucinations.   Pt identifies his primary stressor as consequences of his substance use. He says he has been staying with a friend but is essentially homeless. He says he feels he has let down his two children, ages 65 and 48. Pt is unemployed. He cannot identify any family or friends who are supportive. He denies any current legal problems. Pt has no current outpatient providers.   Pt is dressed in hospital scrubs, alert, oriented x4 with normal speech and normal motor behavior. Eye contact is good. Pt's mood is depressed and affect is congruent with mood. Thought process is coherent and relevant. There is no obvious indication Pt is currently responding to internal stimuli or experiencing delusional thought content. Pt was cooperative throughout assessment. He says he is willing to sign voluntarily into a psychiatric  facility.   Diagnosis: Major Depressive Disorder, Recurrent, Severe; Cocaine Use, Disorder; Alcohol Use Disorder; Cannabis Use Disorder  Past Medical History:  Past Medical History:  Diagnosis Date  . Chronic back pain   . Herniated disc   . Hyperlipidemia     History reviewed. No pertinent surgical history.  Family History:  Family History  Problem Relation Age of Onset  . Bladder Cancer Mother   . Pancreatic cancer Father     Social History:  reports that he has been smoking Cigarettes.  He has been smoking about 10.00 packs per day. He has never used smokeless tobacco. He reports that he drinks alcohol. He reports that he does not use drugs.  Additional Social History:  Alcohol / Drug Use Pain Medications: See MAR Prescriptions: See MAR Over the Counter: See MAR History of alcohol / drug use?: Yes Longest period of sobriety (when/how long): Pt sts for almost 10 years about 2 years  ago Negative Consequences of Use: Financial, Personal relationships Substance #1 Name of Substance 1: Alcohol 1 - Age of First Use: 14 and routine 23 and sobered up at 34 and started againat 44 1 - Amount (size/oz): 1 case a day 1 - Frequency: daily 1 - Duration: 2 years  1 - Last Use / Amount: 03/10/2017 Substance #2 Name of Substance 2: Cocaine 2 - Age of First Use: 26 2 - Amount (size/oz): 1 blunt  2 - Frequency: recreational  2 - Duration: ongoing 2 - Last Use / Amount: 2 days ago  Substance #3 Name of  Substance 3: Marijuana 3 - Age of First Use: 23 3 - Amount (size/oz): Small amount 3 - Frequency: Daily when available 3 - Duration: Ongoing  3 - Last Use / Amount: 03/11/17 Substance #4 Name of Substance 4: MDMA 4 - Age of First Use: 26 4 - Amount (size/oz): Varies 4 - Frequency: Infrequent 4 - Duration: Ongoing  4 - Last Use / Amount: 03/08/17  CIWA: CIWA-Ar BP: 118/78 Pulse Rate: (!) 104 COWS:    PATIENT STRENGTHS: (choose at least two) Ability for insight Average  or above average intelligence Capable of independent living Communication skills Motivation for treatment/growth Physical Health  Allergies: No Known Allergies  Home Medications:  (Not in a hospital admission)  OB/GYN Status:  No LMP for male patient.  General Assessment Data [facility] Assessment: In system Is this a Tele or Face-to-Face Assessment?: Face-to-Face Is this an Initial Assessment or a Re-assessment for this encounter?: Initial Assessment Marital status: [place] Is patient pregnant?: No Pregnancy Status: No Living Arrangements: Other (Comment) (Homeless) Can pt return to current living arrangement?: Yes Admission Status: Voluntary Is patient capable of signing voluntary admission?: Yes Referral Source: Self/Family/Friend Insurance type: Self-pay     Crisis Care Plan Living Arrangements: Other (Comment) (Homeless) Legal Guardian: Other: (Self) Name of Psychiatrist: None Name of Therapist: None  Education Status Is patient currently in school?: No Current Grade: NA Highest grade of school patient has completed: Some college Name of school: NA Contact person: NA  Risk to self with the past 6 months Suicidal Ideation: Yes-Currently Present Has patient been a risk to self within the past 6 months prior to admission? : Yes Suicidal Intent: Yes-Currently Present Has patient had any suicidal intent within the past 6 months prior to admission? : Yes Is patient at risk for suicide?: Yes Suicidal Plan?: Yes-Currently Present Has patient had any suicidal plan within the past 6 months prior to admission? : Yes Specify Current Suicidal Plan: Jump from overpass, run into traffic, hang himself Access to Means: Yes Specify Access to Suicidal Means: Access to traffic What has been your use of drugs/alcohol within the last 12 months?: Pt reports using "whatever I can get" Previous Attempts/Gestures: Yes How many times?:  1 Other Self Harm Risks: None Triggers for Past Attempts: Family contact Intentional Self Injurious Behavior: None Family Suicide History: No Recent stressful life event(s): Conflict (Comment), Financial Problems, Job Loss Persecutory voices/beliefs?: No Depression: Yes Depression Symptoms: Despondent, Insomnia, Tearfulness, Isolating, Fatigue, Guilt, Loss of interest in usual pleasures, Feeling worthless/self pity, Feeling angry/irritable Substance abuse history and/or treatment for substance abuse?: Yes Suicide prevention information given to non-admitted patients: Not applicable  Risk to Others within the past 6 months Homicidal Ideation: No Does patient have any lifetime risk of violence toward others beyond the six months prior to admission? : No Thoughts of Harm to Others: No Current Homicidal Intent: No Current Homicidal Plan: No Access to Homicidal Means: No Identified Victim: None History of harm to others?: No Assessment of Violence: None Noted Violent Behavior Description: Pt denies history of violence Does patient have access to weapons?: No Criminal Charges Pending?: No Does patient have a court date: No Is patient on probation?: No  Psychosis Hallucinations: Auditory (Hearing voices telling him to kill himself when high) Delusions: None noted  Mental Status Report Appearance/Hygiene: In scrubs, Disheveled Eye Contact: Good Motor Activity: Unremarkable Speech: Logical/coherent Level of Consciousness: Alert Mood: Depressed Affect: Depressed Anxiety Level: Minimal Thought Processes:  Coherent, Relevant Judgement: Partial Orientation: Person, Place, Time, Situation, Appropriate for developmental age Obsessive Compulsive Thoughts/Behaviors: None  Cognitive Functioning Concentration: Normal Memory: Recent Intact, Remote Intact IQ: Average Insight: Fair Impulse Control: Fair Appetite: Fair Weight Loss: 0 Weight Gain: 0 Sleep: Decreased Total Hours of  Sleep: 2 Vegetative Symptoms: None  ADLScreening [facility] Assessment Services) Patient's cognitive ability adequate to safely complete daily activities?: Yes Patient able to express need for assistance with ADLs?: Yes Independently performs ADLs?: Yes (appropriate for developmental age)  Prior Inpatient Therapy Prior Inpatient Therapy: Yes Prior Therapy Dates: 06/2015 Prior Therapy Facilty/Provider(s): [facility] Reason for Treatment: Suicide attempt  Prior Outpatient Therapy Prior Outpatient Therapy: No Prior Therapy Dates: NA Prior Therapy Facilty/Provider(s): NA Reason for Treatment: NA Does patient have an ACCT team?: No Does patient have Intensive In-House Services?  : No Does patient have [facility] services? : No Does patient have P4CC services?: No  ADL Screening (condition at time of admission) Patient's cognitive ability adequate to safely complete daily activities?: Yes Is the patient deaf or have difficulty hearing?: No Does the patient have difficulty seeing, even when wearing glasses/contacts?: No Does the patient have difficulty concentrating, remembering, or making decisions?: No Patient able to express need for assistance with ADLs?: Yes Does the patient have difficulty dressing or bathing?: No Independently performs ADLs?: Yes (appropriate for developmental age) Does the patient have difficulty walking or climbing stairs?: No Weakness of Legs: None Weakness of Arms/Hands: None  Home Assistive Devices/Equipment Home Assistive Devices/Equipment: None    Abuse/Neglect Assessment (Assessment to be complete while patient is alone) Physical Abuse: Denies Verbal Abuse: Denies Sexual Abuse: Denies Exploitation of patient/patient's resources: Denies Self-Neglect: Denies     Merchant navy officer (For Healthcare) Does Patient Have a Medical Advance Directive?: No Would patient like information on creating a medical advance directive?: No - Patient declined     Additional Information 1:1 In Past 12 Months?: No CIRT Risk: No Elopement Risk: No Does patient have medical clearance?: Yes     Disposition: Gave clinical report to Nira Conn, NP who said Pt meets criteria for inpatient dual-diagnosis treatment and accepts Pt to the service of Dr. Carmon Ginsberg. [place]. Binnie Rail, [facility] at [facility], said a bed will be available after 0800. Pt signed voluntary consent for treatment. Notified nursing staff at [facility] and Dr. Glynn Octave of acceptance.  Disposition Initial Assessment Completed for this Encounter: Yes Disposition of Patient: Inpatient treatment program Type of inpatient treatment program: Adult   Pamalee Leyden, [facility], [facility], [facility] Triage Specialist [phone]   Patsy [place], Harlin Rain 03/12/2017 1:18 AM

## 2017-03-12 NOTE — H&P (Signed)
Psychiatric Admission Assessment Adult  Patient Identification: Leonard Martinez MRN:  [id number] Date of Evaluation:  03/12/2017 Chief Complaint:  MDD,REC,SEV COCAINE USE DISORDER ETOH USE DISORDER CANNABIS USE DISORDER Principal Diagnosis: MDD (major depressive disorder), recurrent, severe, with psychosis ([employer]) Diagnosis:   Patient Active Problem List   Diagnosis Date Noted  . Cocaine use disorder, mild, abuse [F14.10] 06/28/2015    Priority: High  . Substance induced mood disorder ([place]) [F19.94] 06/26/2015    Priority: High  . Homelessness [Z59.0] 03/12/2017    Priority: Medium  . MDD (major depressive disorder), recurrent, severe, with psychosis ([place]) [F33.3] 03/13/2017  . MDD (major depressive disorder), single episode, severe , no psychosis ([place]) [F32.2] 06/28/2015  . Tobacco use disorder [F17.200] 06/28/2015  . Cannabis use disorder, severe, dependence ([place]) [F12.20] 06/28/2015  . Alcohol use disorder, moderate, in sustained remission ([place]) [F10.21] 06/28/2015  . Suicide attempt ([place]) [T14.91XA] 06/27/2015  . Salicylate overdose [D78.242P] 06/25/2015   Subjective: "I've been so overwhelmed with everything, wanting to end my life feeling like it is the only solution. The voices keep telling me to do it also." Pt seen and chart reviewed. Pt is alert/oriented x4, calm, cooperative, and appropriate to situation. Pt denies homicidal ideation and does not appear to be responding to internal stimuli. Pt endorses suicidal ideation with plan to harm himself any way possible. Pt states a longstanding history of substance abuse and "self-medicating" with drugs. Pt cites auditory hallucinations telling him to harm himself.  History of Present Illness: I have reviewed and concur with HPI elements below, modified as follows: "Leonard Martinez is an 46 y.o. divorced male who presents unaccompanied to [facility] reporting substance abuse and depressive symptoms, including suicidal ideation. Pt  reports he is using various substances, "anything I can." He says his life is "a mess" since he divorce from his wife two years ago. He reports a long history of using alcohol and various drugs and states recently he has been using alcohol, cocaine, marijuana and MDMA. He says he has used meth in the past but he doesn't like it. He says he doesn't use as often as he would like because he cannot afford it. He says when he is high he hears auditory hallucinations telling him to "end it all". Pt reports current suicidal ideation with plan to either jump from an overpass, run into traffic or hang himself. Pt reports he attempted suicide in August 2016 by overdosing on 100 tabs of aspirin and was in ICU before being transferred to [facility]. Pt denies current homicidal ideation but says he has thoughts of harming people when he is intoxicated. He denies any history of violence. Pt denies visual hallucinations.   Pt identifies his primary stressor as consequences of his substance use. He says he has been staying with a friend but is essentially homeless. He says he feels he has let down his two children, ages 44 and 24. Pt is unemployed. He cannot identify any family or friends who are supportive. He denies any current legal problems. Pt has no current outpatient providers."  Associated Signs/Symptoms: Depression Symptoms:  depressed mood, anhedonia, insomnia, psychomotor retardation, feelings of worthlessness/guilt, difficulty concentrating, hopelessness, impaired memory, recurrent thoughts of death, suicidal thoughts with specific plan, anxiety, (Hypo) Manic Symptoms:  Delusions, Flight of Ideas, Hallucinations, Impulsivity, Anxiety Symptoms:  Excessive Worry, Psychotic Symptoms:  Hallucinations: Auditory PTSD Symptoms: NA Total Time spent with patient: 45 minutes  Past Psychiatric History: MDD, hallucinations, substance abuse  Is the patient at  risk to self? Yes.    Has the patient been a  risk to self in the past 6 months? Yes.    Has the patient been a risk to self within the distant past? Yes.    Is the patient a risk to others? No.  Has the patient been a risk to others in the past 6 months? No.  Has the patient been a risk to others within the distant past? No.   Prior Inpatient Therapy:   Prior Outpatient Therapy:    Alcohol Screening:   Substance Abuse History in the last 12 months:  No. Consequences of Substance Abuse: mood instability Previous Psychotropic Medications: Yes  Psychological Evaluations: Yes  Past Medical History:  Past Medical History:  Diagnosis Date  . Chronic back pain   . Herniated disc   . Hyperlipidemia    No past surgical history on file. Family History:  Family History  Problem Relation Age of Onset  . Bladder Cancer Mother   . Pancreatic cancer Father    Family Psychiatric  History: depression Tobacco Screening:   Social History:  History  Alcohol Use  . Yes     History  Drug Use No    Additional Social History:                           Allergies:  No Known Allergies Lab Results:  Results for orders placed or performed during the hospital encounter of 03/11/17 (from the past 48 hour(s))  Comprehensive metabolic panel     Status: Abnormal   Collection Time: 03/11/17  6:44 PM  Result Value Ref Range   Sodium 138 135 - 145 mmol/L   Potassium 3.8 3.5 - 5.1 mmol/L   Chloride 102 101 - 111 mmol/L   CO2 25 22 - 32 mmol/L   Glucose, Bld 110 (H) 65 - 99 mg/dL   BUN <5 (L) 6 - 20 mg/dL   Creatinine, Ser 0.92 0.61 - 1.24 mg/dL   Calcium 9.1 8.9 - 10.3 mg/dL   Total Protein 6.9 6.5 - 8.1 g/dL   Albumin 3.8 3.5 - 5.0 g/dL   AST 24 15 - 41 U/L   ALT 20 17 - 63 U/L   Alkaline Phosphatase 83 38 - 126 U/L   Total Bilirubin 0.7 0.3 - 1.2 mg/dL   GFR calc non Af Amer >60 >60 mL/min   GFR calc Af Amer >60 >60 mL/min    Comment: (NOTE) The eGFR has been calculated using the CKD EPI equation. This calculation has  not been validated in all clinical situations. eGFR's persistently <60 mL/min signify possible Chronic Kidney Disease.    Anion gap 11 5 - 15  CBC with Diff     Status: None   Collection Time: 03/11/17  6:44 PM  Result Value Ref Range   WBC 5.1 4.0 - 10.5 K/uL   RBC 4.65 4.22 - 5.81 MIL/uL   Hemoglobin 14.3 13.0 - 17.0 g/dL   HCT 40.7 39.0 - 52.0 %   MCV 87.5 78.0 - 100.0 fL   MCH 30.8 26.0 - 34.0 pg   MCHC 35.1 30.0 - 36.0 g/dL   RDW 13.0 11.5 - 15.5 %   Platelets 263 150 - 400 K/uL   Neutrophils Relative % 51 %   Neutro Abs 2.7 1.7 - 7.7 K/uL   Lymphocytes Relative 29 %   Lymphs Abs 1.5 0.7 - 4.0 K/uL   Monocytes Relative 15 %  Monocytes Absolute 0.8 0.1 - 1.0 K/uL   Eosinophils Relative 4 %   Eosinophils Absolute 0.2 0.0 - 0.7 K/uL   Basophils Relative 1 %   Basophils Absolute 0.0 0.0 - 0.1 K/uL  I-Stat Chem 8, ED  (not at [facility], [facility])     Status: Abnormal   Collection Time: 03/11/17  6:56 PM  Result Value Ref Range   Sodium 138 135 - 145 mmol/L   Potassium 3.8 3.5 - 5.1 mmol/L   Chloride 104 101 - 111 mmol/L   BUN 6 6 - 20 mg/dL   Creatinine, Ser 0.90 0.61 - 1.24 mg/dL   Glucose, Bld 107 (H) 65 - 99 mg/dL   Calcium, Ion 1.06 (L) 1.15 - 1.40 mmol/L   TCO2 26 0 - 100 mmol/L   Hemoglobin 14.3 13.0 - 17.0 g/dL   HCT 42.0 39.0 - 52.0 %  Urine rapid drug screen (hosp performed)not at [facility]     Status: Abnormal   Collection Time: 03/11/17 10:10 PM  Result Value Ref Range   Opiates NONE DETECTED NONE DETECTED   Cocaine POSITIVE (A) NONE DETECTED   Benzodiazepines NONE DETECTED NONE DETECTED   Amphetamines NONE DETECTED NONE DETECTED   Tetrahydrocannabinol POSITIVE (A) NONE DETECTED   Barbiturates NONE DETECTED NONE DETECTED    Comment:        DRUG SCREEN FOR MEDICAL PURPOSES ONLY.  IF CONFIRMATION IS NEEDED FOR ANY PURPOSE, NOTIFY LAB WITHIN 5 DAYS.        LOWEST DETECTABLE LIMITS FOR URINE DRUG SCREEN Drug Class       Cutoff (ng/mL) Amphetamine       1000 Barbiturate      200 Benzodiazepine   696 Tricyclics       295 Opiates          300 Cocaine          300 THC              50   Salicylate level     Status: None   Collection Time: 03/12/17 12:25 AM  Result Value Ref Range   Salicylate Lvl <2.8 2.8 - 30.0 mg/dL  Acetaminophen level     Status: Abnormal   Collection Time: 03/12/17 12:25 AM  Result Value Ref Range   Acetaminophen (Tylenol), Serum <10 (L) 10 - 30 ug/mL    Comment:        THERAPEUTIC CONCENTRATIONS VARY SIGNIFICANTLY. A RANGE OF 10-30 ug/mL MAY BE AN EFFECTIVE CONCENTRATION FOR MANY PATIENTS. HOWEVER, SOME ARE BEST TREATED AT CONCENTRATIONS OUTSIDE THIS RANGE. ACETAMINOPHEN CONCENTRATIONS >150 ug/mL AT 4 HOURS AFTER INGESTION AND >50 ug/mL AT 12 HOURS AFTER INGESTION ARE OFTEN ASSOCIATED WITH TOXIC REACTIONS.     Blood Alcohol level:  Lab Results  Component Value Date   ETH <5 41/32/4401    Metabolic Disorder Labs:  No results found for: HGBA1C, MPG No results found for: PROLACTIN No results found for: CHOL, TRIG, HDL, CHOLHDL, VLDL, LDLCALC  Current Medications: No current facility-administered medications for this encounter.    PTA Medications: Prescriptions Prior to Admission  Medication Sig Dispense Refill Last Dose  . acetaminophen (TYLENOL) 500 MG tablet Take 1,000 mg by mouth every 6 (six) hours as needed for mild pain.   Past Month at Unknown time  . methocarbamol (ROBAXIN) 500 MG tablet Take 1-2 tablets (500-1,000 mg total) by mouth every 6 (six) hours as needed. 20 tablet 0 Past Week at Unknown time  . penicillin v potassium (VEETID) 500 MG  tablet Take 1 tablet (500 mg total) by mouth 4 (four) times daily. 28 tablet 0 Past Week at Unknown time    Musculoskeletal: Strength & Muscle Tone: within normal limits Gait & Station: normal Patient leans: N/A  Psychiatric Specialty Exam: Physical Exam  Review of Systems  Psychiatric/Behavioral: Positive for depression, substance abuse and  suicidal ideas. Negative for hallucinations. The patient is nervous/anxious and has insomnia.   All other systems reviewed and are negative.   Blood pressure 100/62, pulse 90, temperature 98.5 F (36.9 C), temperature source Oral, resp. rate 18, height 5' 8"  (1.727 m), weight 76.7 kg (169 lb).Body mass index is 25.7 kg/m.  General Appearance: Casual and Fairly Groomed  Eye Contact:  Fair  Speech:  Clear and Coherent and Normal Rate  Volume:  Normal  Mood:  Anxious and Depressed  Affect:  Appropriate, Congruent and Depressed  Thought Process:  Coherent, Goal Directed, Linear and Descriptions of Associations: Intact  Orientation:  Full (Time, Place, and Person)  Thought Content:  Focused on treatment options  Suicidal Thoughts:  Yes.  with intent/plan  Homicidal Thoughts:  No  Memory:  Immediate;   Fair Recent;   Fair Remote;   Fair  Judgement:  Fair  Insight:  Fair  Psychomotor Activity:  Normal  Concentration:  Concentration: Fair and Attention Span: Fair  Recall:  [employer] of Knowledge:  Fair  Language:  Fair  Akathisia:  No  Handed:    AIMS (if indicated):     Assets:  Communication Skills Desire for Improvement Resilience Social Support  ADL's:  Intact  Cognition:  WNL  Sleep:       Treatment Plan Summary: MDD (major depressive disorder), recurrent, severe, with psychosis ([place]) with polysubstance abuse and substance-induced mood disorder, unstable, treated as below:  Medications: -Ativan (PRN only) CIWA protocol -Zoloft 90m po daily for depression, will titrate to 566mif tolerated -Rozerem 44m63mo qhs prn insomnia -Nicotine patch -Mobic 7.5mg44m daily for chronic back pain  Labs/tests/other: -Reviewed CBC, CMP, UDS, all WNL  Observation Level/Precautions:  15 minute checks  Laboratory:  Labs resulted, reviewed, and stable at this time.   Psychotherapy:  Group therapy, individual therapy, psychoeducation  Medications:  See MAR above  Consultations: None     Discharge Concerns: None    Estimated LOS: 5-7 days  Other:  N/A    Physician Treatment Plan for Primary Diagnosis: MDD (major depressive disorder), recurrent, severe, with psychosis ([facility] Term Goal(s): Improvement in symptoms so as ready for discharge  Short Term Goals: Ability to identify changes in lifestyle to reduce recurrence of condition will improve, Ability to verbalize feelings will improve, Ability to disclose and discuss suicidal ideas, Ability to demonstrate self-control will improve, Ability to identify and develop effective coping behaviors will improve, Ability to maintain clinical measurements within normal limits will improve, Compliance with prescribed medications will improve and Ability to identify triggers associated with substance abuse/mental health issues will improve  Physician Treatment Plan for Secondary Diagnosis: Principal Problem:   MDD (major depressive disorder), recurrent, severe, with psychosis (HCC)[place] Problems:   Substance induced mood disorder (HCC)[place] use disorder, mild, abuse   Homelessness  Long Term Goal(s): Improvement in symptoms so as ready for discharge  Short Term Goals: Ability to identify changes in lifestyle to reduce recurrence of condition will improve, Ability to verbalize feelings will improve, Ability to disclose and discuss suicidal ideas, Ability to demonstrate self-control will improve, Ability to identify and  develop effective coping behaviors will improve, Ability to maintain clinical measurements within normal limits will improve, Compliance with prescribed medications will improve and Ability to identify triggers associated with substance abuse/mental health issues will improve  I certify that inpatient services furnished can reasonably be expected to improve the patient's condition.    Benjamine Mola, [place] 5/5/201812:04 PM

## 2017-03-12 NOTE — Tx Team (Signed)
Initial Treatment Plan 03/12/2017 1:44 PM Chriss Driverhomas Peloso UJW:[id number]    PATIENT STRESSORS: Financial difficulties Health problems Loss of family support Substance abuse   PATIENT STRENGTHS: General fund of knowledge Motivation for treatment/growth Other: Previous history of sobriety   PATIENT IDENTIFIED PROBLEMS: "Come back to some sense of normalcy"  "figure out long term plans"  Depression  Suicidal ideation  Substance abuse             DISCHARGE CRITERIA:  Improved stabilization in mood, thinking, and/or behavior Motivation to continue treatment in a less acute level of care Withdrawal symptoms are absent or subacute and managed without 24-hour nursing intervention  PRELIMINARY DISCHARGE PLAN: Attend 12-step recovery group Placement in alternative living arrangements  PATIENT/FAMILY INVOLVEMENT: This treatment plan has been presented to and reviewed with the patient, Chriss Driverhomas Mcenaney. The patient has been given the opportunity to ask questions and make suggestions.  Vinetta BergamoBarbara M Nelta Caudill, RN 03/12/2017, 1:44 PM

## 2017-03-13 DIAGNOSIS — F333 Major depressive disorder, recurrent, severe with psychotic symptoms: Secondary | ICD-10-CM | POA: Diagnosis present

## 2017-03-13 DIAGNOSIS — Z79899 Other long term (current) drug therapy: Secondary | ICD-10-CM

## 2017-03-13 MED ORDER — LORAZEPAM 1 MG PO TABS: 1.0000 mg | ORAL_TABLET | Freq: Every day | ORAL | Status: DC

## 2017-03-13 MED ORDER — VITAMIN B-1 100 MG PO TABS
100.0000 mg | ORAL_TABLET | Freq: Every day | ORAL | Status: DC
  Administered 2017-03-14 – 2017-03-24 (×11): 100 mg via ORAL
  Filled 2017-03-13 (×13): qty 1
  Filled 2017-03-13: qty 21

## 2017-03-13 MED ORDER — ADULT MULTIVITAMIN W/MINERALS CH
1.0000 | ORAL_TABLET | Freq: Every day | ORAL | Status: DC
  Administered 2017-03-13 – 2017-03-24 (×12): 1 via ORAL
  Filled 2017-03-13 (×15): qty 1

## 2017-03-13 MED ORDER — LORAZEPAM 1 MG PO TABS: 1.0000 mg | ORAL_TABLET | Freq: Four times a day (QID) | ORAL | Status: DC

## 2017-03-13 MED ORDER — ONDANSETRON 4 MG PO TBDP: 4.0000 mg | ORAL_TABLET | Freq: Four times a day (QID) | ORAL | Status: AC | PRN

## 2017-03-13 MED ORDER — DOXYCYCLINE HYCLATE 100 MG PO TABS
200.0000 mg | ORAL_TABLET | Freq: Once | ORAL | Status: AC
  Administered 2017-03-13: 200 mg via ORAL
  Filled 2017-03-13 (×2): qty 2

## 2017-03-13 MED ORDER — THIAMINE HCL 100 MG/ML IJ SOLN
100.0000 mg | Freq: Once | INTRAMUSCULAR | Status: AC
  Administered 2017-03-13: 100 mg via INTRAMUSCULAR
  Filled 2017-03-13: qty 2

## 2017-03-13 MED ORDER — LOPERAMIDE HCL 2 MG PO CAPS: 2.0000 mg | ORAL_CAPSULE | ORAL | Status: AC | PRN

## 2017-03-13 MED ORDER — LORAZEPAM 1 MG PO TABS: 1.0000 mg | ORAL_TABLET | Freq: Three times a day (TID) | ORAL | Status: DC

## 2017-03-13 MED ORDER — LORAZEPAM 1 MG PO TABS: 1.0000 mg | ORAL_TABLET | Freq: Two times a day (BID) | ORAL | Status: DC

## 2017-03-13 MED ORDER — LORAZEPAM 1 MG PO TABS
1.0000 mg | ORAL_TABLET | Freq: Four times a day (QID) | ORAL | Status: AC | PRN
  Administered 2017-03-13 – 2017-03-16 (×5): 1 mg via ORAL
  Filled 2017-03-13 (×5): qty 1

## 2017-03-13 NOTE — Progress Notes (Signed)
D: Pt SI- contracts for safety.  denies HI/AVH. Pt is pleasant and cooperative. Pt concerned that he said the doctor was to start him on Ambien at night to help him sleep.   A: Pt was offered support and encouragement. Pt was given scheduled medications. Pt was encourage to attend groups. Q 15 minute checks were done for safety.   R:Pt attends groups and interacts well with peers and staff. Pt is taking medication. Pt has no complaints at this time .Pt receptive to treatment and safety maintained on unit.

## 2017-03-13 NOTE — Progress Notes (Signed)
Patient attended group, but did not participate. He said today was his first day here and he just wanted to listen.

## 2017-03-13 NOTE — BHH Counselor (Signed)
Unsuccessful attempt to complete PSA as pt was attending group. Will revisit  [place] C Isidora Laham, LCSW 

## 2017-03-13 NOTE — BHH Counselor (Signed)
Adult Comprehensive Assessment  Patient ID: Leonard Martinez, male   DOB: 1971/06/24, 46 y.o.   MRN: [id number]  Information Source: Information source: Patient  Current Stressors:  Educational / Learning stressors: NA Employment / Job issues: Unemployed since March 1st Family Relationships: Strained with ex wife; parents deceased Surveyor, quantityinancial / Lack of resources (include bankruptcy): No income Housing / Lack of housing: Homeless Physical health (include injuries & life threatening diseases): Depression Social relationships: No supports in area Substance abuse: Relapse Bereavement / Loss: Cousin and best friend both died within the last year due to heroin overdose  Living/Environment/Situation:  Living Arrangements: Non-relatives/Friends Living conditions (as described by patient or guardian): patient has been living with acquaintance in home he does not feel safe to return to because of the heavy alcohol and drug use there How long has patient lived in current situation?: couple of weeks; [facility], willing to return there What is atmosphere in current home: Chaotic, Temporary  Family History:  Marital status: Divorced Divorced, when?: 2016 What types of issues is patient dealing with in the relationship?: Ex wife does not want him to see the children  Does patient have children?: Yes How many children?: 2 How is patient's relationship with their children?: Ages 4910 and 6811; patient has been unable to see them for last couple of months  Childhood History:  By whom was/is the patient raised?: Mother/father and step-parent Additional childhood history information: No contact with biological father - he signed over parental rights to mother Description of patient's relationship with caregiver when they were a child: Good with both mother and stepfather - was an athlete - mother welcomed all his friends. Patient's description of current relationship with people who raised  him/her: Both Mother and stepfather and bio father deceased How were you disciplined when you got in trouble as a child/adolescent?: Loss of privileges Does patient have siblings?: No Did patient suffer any verbal/emotional/physical/sexual abuse as a child?: No Did patient suffer from severe childhood neglect?: No Has patient ever been sexually abused/assaulted/raped as an adolescent or adult?: No Was the patient ever a victim of a crime or a disaster?: No Witnessed domestic violence?: No Has patient been effected by domestic violence as an adult?: No  Education:  Highest grade of school patient has completed: 15 Learning disability?: No  Employment/Work Situation:   Employment situation: Unemployed Patient's job has been impacted by current illness: Yes Describe how patient's job has been impacted: Pt lost job due to drinking What is the longest time patient has a held a job?: 14 years 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: [employer] Has patient ever been in the [employer] and Companymilitary?: Yes (Describe in comment) (In [employer]) Has patient ever served in combat?: No Did You Receive Any Psychiatric Treatment/Services While in [employer] traderthe Military?: No Are There Guns or Other Weapons in Your Home?: No  Financial Resources:   Financial resources: No income Does patient have a Lawyerrepresentative payee or guardian?: No  Alcohol/Substance Abuse:   What has been your use of drugs/alcohol within the last 12 months?: Patient reports he will use as much as possible of alcohol (up to a case a day) THC (several blunts per day) and Cocaine (up to a gram a day) Alcohol/Substance Abuse Treatment Hx: Past TX, Inpatient, Past TX, Outpatient, Past detox If yes, describe treatment: [place] Has alcohol/substance abuse ever caused legal problems?: Yes (Patient currently has no  license and will need 14 hours alcohol education before reinstating  license)  Social Support System:   Patient's Community Support System: Poor Describe Community Support System: Poor here in [place] where he came to be closer to his two children; better where he was in [place] Type of faith/religion: Belief in a Higher Power How does patient's faith help to cope with current illness?: Helps with recovery  Leisure/Recreation:   Leisure and Hobbies: Working, Public affairs consultant, reading, hiking/camping  Strengths/Needs:   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, good with children, good with people In what areas does patient struggle / problems for patient: Substance abuse, stability since separation/divorce  Discharge Plan:   Does patient have access to transportation?: No Plan for no access to transportation at discharge: Bus voucher Will patient be returning to same living situation after discharge?: No Plan for living situation after discharge: Hopefully inpatient treatment and then either halfway house or longer treatment Currently receiving community mental health services: No If no, would patient like referral for services when discharged?: Yes (What county?) Medical sales representative) Does patient have financial barriers related to discharge medications?: Yes Patient description of barriers related to discharge medications: No current income or insurance  Summary/Recommendations:   Summary and Recommendations (to be completed by the evaluator): Patient is 46 YO male admitted with history of depression and substance abuse. Stressors include recent move to area with no supports, inability to see children, unemployment since March 1 and recent relapse.  Patient will benefit from crisis stabilization, medication evaluation, group therapy and psycho education, in addition to case management for discharge planning. At discharge it is recommended that patient adhere to the established discharge plan and continue in treatment  [name]. 03/13/2017

## 2017-03-13 NOTE — Progress Notes (Signed)
Patient attended AA group meeting.  

## 2017-03-13 NOTE — BHH Group Notes (Signed)
B[facility] Group Notes:  (Clinical Social Work)   03/13/2017    10:00-11:00AM  Summary of Progress/Problems:   The main focus of today's process group was to   1)  discuss the importance of adding supports  2)   plan what supports to add to improve wellness and recovery  An emphasis was placed on using therapist, doctor, therapy groups, 12-step groups, and problem-specific support groups to expand supports.    The patient stated if he was at home today, he would be "hibernating while hoping to get the courage to check myself in to a place like this."  He stated he had been sober 10 years, relapsed 2 years ago.  He has moved to this area to be close to his children.  He feels it would be best for him to go into a long-term rehabilitation program to address alcohol/drug addiction issues.  Once he is out of there, he will add back in AA/NA meetings as well.  He provided support to others during group.  Type of Therapy:  Process Group with Motivational Interviewing  Participation Level:  Active  Participation Quality:  Attentive, Sharing and Supportive  Affect:  Appropriate  Cognitive:  Appropriate  Insight:  Engaged  Engagement in Therapy:  Engaged  Modes of Intervention:   Education, Support and Processing  Ambrose MantleMareida Grossman-Orr, LCSW 03/13/2017    12:42 PM

## 2017-03-13 NOTE — BHH Suicide Risk Assessment (Signed)
BHH INPATIENT:  Family/Significant Other Suicide Prevention Education  Suicide Prevention Education:  Patient Refusal for Family/Significant Other Suicide Prevention Education: The patient Leonard Martinez has refused to provide written consent for family/significant other to be provided Family/Significant Other Suicide Prevention Education during admission and/or prior to discharge.  Physician notified.  Writer provided suicide prevention education directly to patient; conversation included risk factors, warning signs and resources to contact for help. Mobile crisis services explained and  explanations given as to resources which will be included on patient's discharge paperwork.   [name] 03/13/2017, 4:43 PM

## 2017-03-13 NOTE — [facility] Group Notes (Signed)
[facility] Group Notes:  (Nursing/MHT/Case Management/Adjunct)  Date:  03/13/2017  Time:  4:17 PM  Type of Therapy:  Psychoeducational Skills  Participation Level:  Active  Participation Quality:  Attentive  Affect:  Appropriate  Cognitive:  Appropriate  Insight:  Appropriate  Engagement in Group:  Engaged  Modes of Intervention:  Discussion and Education  Summary of Progress/Problems: Healthy Support Systems - patient attended, was engaged with content and peers. Hannah Crill M Yann Biehn 03/13/2017, 4:17 PM 

## 2017-03-13 NOTE — Progress Notes (Signed)
Leonard Martinez. Leonard Martinez had been up and visible in milieu this evening, did attend evening group activity. Maisie Fushomas spoke about how he was doing drugs and how he spiraled out of control and realized he needed to come in to get some help. He does not report any withdrawal symptoms and spoke about how he probably still has drugs in his system. He did receive bedtime medication without incident this evening. A. Support and encouragement provided. R. Safety maintained, will continue to monitor.

## 2017-03-13 NOTE — Progress Notes (Signed)
[facility]  03/13/2017 7:43 PM Chriss Driverhomas Kluck  MRN:  [id number] Subjective:  "Worried about this deer tick on me. Can't sleep very well. Depression is a little better but kinda irritated all day."  Objective: Pt seen and chart reviewed. Pt is alert/oriented x4, calm, cooperative, and appropriate to situation. Pt denies homicidal ideation and psychosis and does not appear to be responding to internal stimuli. Pt states that he has felt agitated all day and has been isolating in his room to attempt to stay calm so he does not get upset with others in the dayroom and in groups. Removed deer tick from pt's left anterior tibial region, non-erythematous, likely sub-72h attachment, gave 200mg  doxycycline x1 dose per CDC guidelines for Lyme prevention.   Principal Problem: MDD (major depressive disorder), recurrent, severe, with psychosis (HCC) Diagnosis:   Patient Active Problem List   Diagnosis Date Noted  . Cocaine use disorder, mild, abuse [F14.10] 06/28/2015    Priority: High  . Substance induced mood disorder (HCC) [F19.94] 06/26/2015    Priority: High  . Homelessness [Z59.0] 03/12/2017    Priority: Medium  . MDD (major depressive disorder), recurrent, severe, with psychosis (HCC) [F33.3] 03/13/2017  . MDD (major depressive disorder), single episode, severe , no psychosis (HCC) [F32.2] 06/28/2015  . Tobacco use disorder [F17.200] 06/28/2015  . Cannabis use disorder, severe, dependence (HCC) [F12.20] 06/28/2015  . Alcohol use disorder, moderate, in sustained remission (HCC) [F10.21] 06/28/2015  . Suicide attempt (HCC) [T14.91XA] 06/27/2015  . Salicylate overdose [T39.091A] 06/25/2015   Total Time spent with patient: 30 minutes  Past Psychiatric History: see h&P  Past Medical History:  Past Medical History:  Diagnosis Date  . Anxiety   . Chronic back pain   . Herniated disc   . Hyperlipidemia     Past Surgical History:  Procedure Laterality Date  . NO PAST SURGERIES      Family History:  Family History  Problem Relation Age of Onset  . Bladder Cancer Mother   . Pancreatic cancer Father    Family Psychiatric  History: see H&P Social History:  History  Alcohol Use  . Yes    Comment: As much as possible, varies with finances     History  Drug Use  . Types: Amphetamines, MDMA (Ecstacy), Marijuana, Cocaine    Comment: intermittent depending on finances    Social History   Social History  . Marital status: Divorced    Spouse name: N/A  . Number of children: N/A  . Years of education: N/A   Social History Main Topics  . Smoking status: Current Every Day Smoker    Packs/day: 3.00    Years: 2.00    Types: Cigarettes  . Smokeless tobacco: Never Used  . Alcohol use Yes     Comment: As much as possible, varies with finances  . Drug use: Yes    Types: Amphetamines, MDMA (Ecstacy), Marijuana, Cocaine     Comment: intermittent depending on finances  . Sexual activity: Yes   Other Topics Concern  . None   Social History Narrative  . None   Additional Social History:    History of alcohol / drug use?: Yes Longest period of sobriety (when/how long): 12 years Negative Consequences of Use: Financial, Armed forces operational officerLegal, Personal relationships, Work / School Withdrawal Symptoms: Sweats, Tremors                    Sleep: Fair  Appetite:  Good  Current Medications: Current Facility-Administered Medications  Medication Dose Route Frequency Provider Last Rate Last Dose  . acetaminophen (TYLENOL) tablet 650 mg  650 mg Oral Q6H PRN Beau Fanny, FNP   650 mg at 03/13/17 1116  . alum & mag hydroxide-simeth (MAALOX/MYLANTA) 200-200-20 MG/5ML suspension 30 mL  30 mL Oral Q4H PRN Mayuri Staples, Everardo All, FNP      . feeding supplement (ENSURE ENLIVE) (ENSURE ENLIVE) liquid 237 mL  237 mL Oral BID BM Cobos, Fernando A, MD      . hydrOXYzine (ATARAX/VISTARIL) tablet 25 mg  25 mg Oral TID PRN Beau Fanny, FNP   25 mg at 03/13/17 1817  . loperamide  (IMODIUM) capsule 2-4 mg  2-4 mg Oral PRN Buren Havey, Everardo All, FNP      . LORazepam (ATIVAN) tablet 1 mg  1 mg Oral Q6H PRN Beau Fanny, FNP   1 mg at 03/13/17 1740  . magnesium hydroxide (MILK OF MAGNESIA) suspension 30 mL  30 mL Oral Daily PRN Zubayr Bednarczyk C, FNP      . meloxicam (MOBIC) tablet 7.5 mg  7.5 mg Oral Daily Breck Hollinger C, FNP   7.5 mg at 03/13/17 0831  . multivitamin with minerals tablet 1 tablet  1 tablet Oral Daily Rogen Porte, Everardo All, FNP   1 tablet at 03/13/17 1816  . nicotine (NICODERM CQ - dosed in mg/24 hours) patch 21 mg  21 mg Transdermal Daily Beau Fanny, FNP   21 mg at 03/13/17 1610  . ondansetron (ZOFRAN-ODT) disintegrating tablet 4 mg  4 mg Oral Q6H PRN Jamare Vanatta, Everardo All, FNP      . penicillin v potassium (VEETID) tablet 500 mg  500 mg Oral Q6H Lancelot Alyea C, FNP   500 mg at 03/13/17 1823  . ramelteon (ROZEREM) tablet 8 mg  8 mg Oral QHS Beau Fanny, FNP   8 mg at 03/12/17 2117  . sertraline (ZOLOFT) tablet 25 mg  25 mg Oral Daily Beau Fanny, FNP   25 mg at 03/13/17 0831  . [START ON 03/14/2017] thiamine (VITAMIN B-1) tablet 100 mg  100 mg Oral Daily Camey Edell, Everardo All, FNP        Lab Results:  Results for orders placed or performed during the hospital encounter of 03/11/17 (from the past 48 hour(s))  Urine rapid drug screen (hosp performed)not at [facility]     Status: Abnormal   Collection Time: 03/11/17 10:10 PM  Result Value Ref Range   Opiates NONE DETECTED NONE DETECTED   Cocaine POSITIVE (A) NONE DETECTED   Benzodiazepines NONE DETECTED NONE DETECTED   Amphetamines NONE DETECTED NONE DETECTED   Tetrahydrocannabinol POSITIVE (A) NONE DETECTED   Barbiturates NONE DETECTED NONE DETECTED    Comment:        DRUG SCREEN FOR MEDICAL PURPOSES ONLY.  IF CONFIRMATION IS NEEDED FOR ANY PURPOSE, NOTIFY LAB WITHIN 5 DAYS.        LOWEST DETECTABLE LIMITS FOR URINE DRUG SCREEN Drug Class       Cutoff (ng/mL) Amphetamine      1000 Barbiturate       200 Benzodiazepine   200 Tricyclics       300 Opiates          300 Cocaine          300 THC              50   Salicylate level     Status: None   Collection Time: 03/12/17 12:25 AM  Result Value  Ref Range   Salicylate Lvl <7.0 2.8 - 30.0 mg/dL  Acetaminophen level     Status: Abnormal   Collection Time: 03/12/17 12:25 AM  Result Value Ref Range   Acetaminophen (Tylenol), Serum <10 (L) 10 - 30 ug/mL    Comment:        THERAPEUTIC CONCENTRATIONS VARY SIGNIFICANTLY. A RANGE OF 10-30 ug/mL MAY BE AN EFFECTIVE CONCENTRATION FOR MANY PATIENTS. HOWEVER, SOME ARE BEST TREATED AT CONCENTRATIONS OUTSIDE THIS RANGE. ACETAMINOPHEN CONCENTRATIONS >150 ug/mL AT 4 HOURS AFTER INGESTION AND >50 ug/mL AT 12 HOURS AFTER INGESTION ARE OFTEN ASSOCIATED WITH TOXIC REACTIONS.     Blood Alcohol level:  Lab Results  Component Value Date   ETH <5 06/25/2015    Metabolic Disorder Labs: No results found for: HGBA1C, MPG No results found for: PROLACTIN No results found for: CHOL, TRIG, HDL, CHOLHDL, VLDL, LDLCALC  Physical Findings: AIMS: Facial and Oral Movements Muscles of Facial Expression: None, normal Lips and Perioral Area: None, normal Jaw: None, normal Tongue: None, normal,Extremity Movements Upper (arms, wrists, hands, fingers): None, normal Lower (legs, knees, ankles, toes): None, normal, Trunk Movements Neck, shoulders, hips: None, normal, Overall Severity Severity of abnormal movements (highest score from questions above): None, normal Incapacitation due to abnormal movements: None, normal Patient's awareness of abnormal movements (rate only patient's report): No Awareness, Dental Status Current problems with teeth and/or dentures?: Yes Does patient usually wear dentures?: Yes  CIWA:  CIWA-Ar Total: 17 COWS:  COWS Total Score: 0  Musculoskeletal: Strength & Muscle Tone: within normal limits Gait & Station: normal Patient leans: N/A  Psychiatric Specialty  Exam: Physical Exam  Musculoskeletal:       Legs:   Review of Systems  Psychiatric/Behavioral: Positive for depression, hallucinations (improving), substance abuse and suicidal ideas (improving). The patient is nervous/anxious and has insomnia.   All other systems reviewed and are negative.   Blood pressure (!) 117/98, pulse 90, temperature 98.5 F (36.9 C), temperature source Oral, resp. rate 16, height 5\' 8"  (1.727 m), weight 76.7 kg (169 lb).Body mass index is 25.7 kg/m.  General Appearance: Casual and Fairly Groomed  Eye Contact:  Good  Speech:  Clear and Coherent and Normal Rate  Volume:  Normal  Mood:  Anxious and Depressed  Affect:  Appropriate and Congruent  Thought Process:  Coherent, Goal Directed, Linear and Descriptions of Associations: Loose  Orientation:  Full (Time, Place, and Person)  Thought Content:  Focused on tick, sleeping, anxiety  Suicidal Thoughts:  Yes.  without intent/plan  Homicidal Thoughts:  No  Memory:  Immediate;   Fair Recent;   Fair Remote;   Fair  Judgement:  Fair  Insight:  Fair  Psychomotor Activity:  Normal  Concentration:  Concentration: Fair and Attention Span: Fair  Recall:  [employer] of Knowledge:  Fair  Language:  Fair  Akathisia:  No  Handed:    AIMS (if indicated):     Assets:  Communication Skills Desire for Improvement Resilience Social Support  ADL's:  Intact  Cognition:  WNL  Sleep:  Number of Hours: 6.25   Treatment Plan Summary: MDD (major depressive disorder), recurrent, severe, with psychosis (HCC) with polysubstance abuse and substance-induced mood disorder, unstable, treated as below:  On 03/13/2017 , I have reviewed medications below and concur with regimen with the following changes:    Medications: -Ativan (PRN only) CIWA protocol -Zoloft 25mg  po daily for depression, will titrate to 50mg  if tolerated -Rozerem 8mg  po qhs prn insomnia -Nicotine patch -Mobic  7.5mg  po daily for chronic back pain -Give x1  dose of Doxycycline 200mg  per CDC Lyme prevention guidelines  Labs/tests/other: -Reviewed CBC, CMP, UDS, all WNL  Beau Fanny, FNP 03/13/2017, 7:43 PM

## 2017-03-13 NOTE — [facility] Counselor (Signed)
Second unsuccessful PSA attempt as patient was in bed with covers pulled overhead; patient made no movements when his name was called loudly on two approaches. Will complete PSA after lunch.  Carney Bernatherine C Earmon Sherrow, LCSW

## 2017-03-13 NOTE — Plan of Care (Signed)
Problem: Safety: Goal: Periods of time without injury will increase Outcome: Progressing Pt safe on the unit at this time   

## 2017-03-14 DIAGNOSIS — F333 Major depressive disorder, recurrent, severe with psychotic symptoms: Principal | ICD-10-CM

## 2017-03-14 DIAGNOSIS — F1990 Other psychoactive substance use, unspecified, uncomplicated: Secondary | ICD-10-CM

## 2017-03-14 DIAGNOSIS — F17210 Nicotine dependence, cigarettes, uncomplicated: Secondary | ICD-10-CM

## 2017-03-14 MED ORDER — ENSURE ENLIVE PO LIQD
237.0000 mL | Freq: Every day | ORAL | Status: DC | PRN
  Administered 2017-03-14 – 2017-03-18 (×5): 237 mL via ORAL
  Filled 2017-03-14 (×5): qty 237

## 2017-03-14 MED ORDER — MELOXICAM 7.5 MG PO TABS
15.0000 mg | ORAL_TABLET | Freq: Every day | ORAL | Status: DC
  Administered 2017-03-15 – 2017-03-24 (×10): 15 mg via ORAL
  Filled 2017-03-14: qty 42
  Filled 2017-03-14 (×6): qty 1
  Filled 2017-03-14: qty 2
  Filled 2017-03-14 (×5): qty 1

## 2017-03-14 MED ORDER — QUETIAPINE FUMARATE 25 MG PO TABS
25.0000 mg | ORAL_TABLET | Freq: Every day | ORAL | Status: DC
  Administered 2017-03-14: 25 mg via ORAL
  Filled 2017-03-14 (×3): qty 1

## 2017-03-14 NOTE — Tx Team (Signed)
Interdisciplinary Treatment and Diagnostic Plan Update 03/14/2017 Time of Session: 9:30am  Leonard Martinez  MRN: [id number]  Principal Diagnosis: MDD (major depressive disorder), recurrent, severe, with psychosis ([place])  Secondary Diagnoses: Principal Problem:   MDD (major depressive disorder), recurrent, severe, with psychosis ([place]) Active Problems:   Substance induced mood disorder ([place])   Cocaine use disorder, mild, abuse   Homelessness   Current Medications:  Current Facility-Administered Medications  Medication Dose Route Frequency Provider Last Rate Last Dose  . acetaminophen (TYLENOL) tablet 650 mg  650 mg Oral Q6H PRN Benjamine Mola, FNP   650 mg at 03/13/17 1116  . alum & mag hydroxide-simeth (MAALOX/MYLANTA) 200-200-20 MG/5ML suspension 30 mL  30 mL Oral Q4H PRN Withrow, Elyse Jarvis, FNP      . feeding supplement (ENSURE ENLIVE) (ENSURE ENLIVE) liquid 237 mL  237 mL Oral Daily PRN Cobos, Fernando A, MD      . hydrOXYzine (ATARAX/VISTARIL) tablet 25 mg  25 mg Oral TID PRN Benjamine Mola, FNP   25 mg at 03/13/17 1817  . loperamide (IMODIUM) capsule 2-4 mg  2-4 mg Oral PRN Withrow, Elyse Jarvis, FNP      . LORazepam (ATIVAN) tablet 1 mg  1 mg Oral Q6H PRN Benjamine Mola, FNP   1 mg at 03/13/17 1740  . magnesium hydroxide (MILK OF MAGNESIA) suspension 30 mL  30 mL Oral Daily PRN Withrow, Elyse Jarvis, FNP      . [START ON 03/15/2017] meloxicam (MOBIC) tablet 15 mg  15 mg Oral Daily Kerrie Buffalo, NP      . multivitamin with minerals tablet 1 tablet  1 tablet Oral Daily Withrow, Elyse Jarvis, FNP   1 tablet at 03/14/17 [phone]  . nicotine (NICODERM CQ - dosed in mg/24 hours) patch 21 mg  21 mg Transdermal Daily Benjamine Mola, FNP   21 mg at 03/14/17 0814  . ondansetron (ZOFRAN-ODT) disintegrating tablet 4 mg  4 mg Oral Q6H PRN Withrow, Elyse Jarvis, FNP      . penicillin v potassium (VEETID) tablet 500 mg  500 mg Oral Q6H Withrow, John C, FNP   500 mg at 03/14/17 1213  . QUEtiapine (SEROQUEL) tablet 25 mg  25  mg Oral QHS Kerrie Buffalo, NP      . sertraline (ZOLOFT) tablet 25 mg  25 mg Oral Daily Benjamine Mola, FNP   25 mg at 03/14/17 0814  . thiamine (VITAMIN B-1) tablet 100 mg  100 mg Oral Daily Benjamine Mola, FNP   100 mg at 03/14/17 [id number]    PTA Medications: Prescriptions Prior to Admission  Medication Sig Dispense Refill Last Dose  . acetaminophen (TYLENOL) 500 MG tablet Take 1,000 mg by mouth every 6 (six) hours as needed for mild pain.   Past Month at Unknown time  . methocarbamol (ROBAXIN) 500 MG tablet Take 1-2 tablets (500-1,000 mg total) by mouth every 6 (six) hours as needed. 20 tablet 0 Past Week at Unknown time  . penicillin v potassium (VEETID) 500 MG tablet Take 1 tablet (500 mg total) by mouth 4 (four) times daily. 28 tablet 0 Past Week at Unknown time    Treatment Modalities: Medication Management, Group therapy, Case management,  1 to 1 session with clinician, Psychoeducation, Recreational therapy.  Patient Stressors: Financial difficulties Health problems Loss of family support Substance abuse Patient Strengths: Technical sales engineer for treatment/growth Other: Previous history of sobriety  Physician Treatment Plan for Primary Diagnosis: MDD (major depressive disorder), recurrent,  severe, with psychosis ([place]) Long Term Goal(s): Improvement in symptoms so as ready for discharge Short Term Goals: Ability to identify changes in lifestyle to reduce recurrence of condition will improve Ability to verbalize feelings will improve Ability to disclose and discuss suicidal ideas Ability to demonstrate self-control will improve Ability to identify and develop effective coping behaviors will improve Ability to maintain clinical measurements within normal limits will improve Compliance with prescribed medications will improve Ability to identify triggers associated with substance abuse/mental health issues will improve Ability to identify changes in lifestyle to  reduce recurrence of condition will improve Ability to verbalize feelings will improve Ability to disclose and discuss suicidal ideas Ability to demonstrate self-control will improve Ability to identify and develop effective coping behaviors will improve Ability to maintain clinical measurements within normal limits will improve Compliance with prescribed medications will improve Ability to identify triggers associated with substance abuse/mental health issues will improve  Medication Management: Evaluate patient's response, side effects, and tolerance of medication regimen.  Therapeutic Interventions: 1 to 1 sessions, Unit Group sessions and Medication administration.  Evaluation of Outcomes: Not Met  Physician Treatment Plan for Secondary Diagnosis: Principal Problem:   MDD (major depressive disorder), recurrent, severe, with psychosis ([place]) Active Problems:   Substance induced mood disorder (HCC)   Cocaine use disorder, mild, abuse   Homelessness  Long Term Goal(s): Improvement in symptoms so as ready for discharge  Short Term Goals: Ability to identify changes in lifestyle to reduce recurrence of condition will improve Ability to verbalize feelings will improve Ability to disclose and discuss suicidal ideas Ability to demonstrate self-control will improve Ability to identify and develop effective coping behaviors will improve Ability to maintain clinical measurements within normal limits will improve Compliance with prescribed medications will improve Ability to identify triggers associated with substance abuse/mental health issues will improve Ability to identify changes in lifestyle to reduce recurrence of condition will improve Ability to verbalize feelings will improve Ability to disclose and discuss suicidal ideas Ability to demonstrate self-control will improve Ability to identify and develop effective coping behaviors will improve Ability to maintain clinical  measurements within normal limits will improve Compliance with prescribed medications will improve Ability to identify triggers associated with substance abuse/mental health issues will improve  Medication Management: Evaluate patient's response, side effects, and tolerance of medication regimen.  Therapeutic Interventions: 1 to 1 sessions, Unit Group sessions and Medication administration.  Evaluation of Outcomes: Not Met  RN Treatment Plan for Primary Diagnosis: MDD (major depressive disorder), recurrent, severe, with psychosis ([place]) Long Term Goal(s): Knowledge of disease and therapeutic regimen to maintain health will improve  Short Term Goals: Ability to remain free from injury will improve and Compliance with prescribed medications will improve  Medication Management: RN will administer medications as ordered by provider, will assess and evaluate patient's response and provide education to patient for prescribed medication. RN will report any adverse and/or side effects to prescribing provider.  Therapeutic Interventions: 1 on 1 counseling sessions, Psychoeducation, Medication administration, Evaluate responses to treatment, Monitor vital signs and CBGs as ordered, Perform/monitor CIWA, COWS, AIMS and Fall Risk screenings as ordered, Perform wound care treatments as ordered.  Evaluation of Outcomes: Not Met  LCSW Treatment Plan for Primary Diagnosis: MDD (major depressive disorder), recurrent, severe, with psychosis ([place]) Long Term Goal(s): Safe transition to appropriate next level of care at discharge, Engage patient in therapeutic group addressing interpersonal concerns. Short Term Goals: Engage patient in aftercare planning with referrals and resources, Facilitate patient progression  through stages of change regarding substance use diagnoses and concerns, Identify triggers associated with mental health/substance abuse issues and Increase skills for wellness and recovery  Therapeutic  Interventions: Assess for all discharge needs, 1 to 1 time with Social worker, Explore available resources and support systems, Assess for adequacy in community support network, Educate family and significant other(s) on suicide prevention, Complete Psychosocial Assessment, Interpersonal group therapy.  Evaluation of Outcomes: Not Met  Progress in Treatment: Attending groups: Pt is new to milieu, continuing to assess  Participating in groups: Pt is new to milieu, continuing to assess  Taking medication as prescribed: Yes, MD continues to assess for medication changes as needed Toleration medication: Yes, no side effects reported at this time Family/Significant other contact made: No, CSW assessing for appropriate contact Patient understands diagnosis: Continuing to assess Discussing patient identified problems/goals with staff: Yes Medical problems stabilized or resolved: Yes Denies suicidal/homicidal ideation:  Issues/concerns per patient self-inventory: None Other: N/A  New problem(s) identified: None identified at this time.   New Short Term/Long Term Goal(s): None identified at this time.   Discharge Plan or Barriers: Pt will discharge to residential treatment. [facility] referral and [facility] referral made 5/7.   Reason for Continuation of Hospitalization:  Anxiety  Depression Medication stabilization Suicidal ideation Withdrawal symptoms  Estimated Length of Stay: 3-5 days  Attendees: Patient: 03/14/2017 1:16 PM  Physician: Dr. Parke Poisson 03/14/2017 1:16 PM  Nursing: Sharl Ma RN; Opal Sidles, RN 03/14/2017 1:16 PM  RN Care Manager: Lars Pinks, RN 03/14/2017 1:16 PM  Social Worker: Matthew Saras, [place] 03/14/2017 1:16 PM  Recreational Therapist:  03/14/2017 1:16 PM  Other: Lindell Spar, NP; Samuel Jester, NP 03/14/2017 1:16 PM  Other:  03/14/2017 1:16 PM  Other: 03/14/2017 1:16 PM   Scribe for Treatment Team: Georga Kaufmann, MSW,LCSWA 03/14/2017 1:16 PM

## 2017-03-14 NOTE — Progress Notes (Signed)
Per pt request, CS[facility] Pt has a [facility] screening date on 5/10. Pt will [place]. CSW will check in with pt to confirm that he meets criteria.   Jonathon JordanLynn B Griffith Santilli, MSW, Theresia MajorsLCSWA [phone]

## 2017-03-14 NOTE — Progress Notes (Addendum)
D: Pt presents with flat affect and depressed mood. Pt rates depression 8/10. Anxiety 9/10. Pt endorses suicidal thoughts with no plan or intent. Pt verbally contracts for safety.  Pt endorses intermittent AVH. Pt reports difficulty sleeping last night due to racing thoughts. Pt stated "Dr. Renata Capriceonrad was supposed to start me on a new sleeping medication last night but he didn't. What was the point of discussing medications if he wasn't going to listen". Gaspar ColaSheila May, NP., made aware of pt compliant. Pt reports withdrawal symptoms of agitation, cravings, cramps loose stools and irritability. A: Mediations reviewed with pt. Medications administered as ordered per MD. Verbal support provided. Pt encouraged to attend groups. 15 minute checks performed for safety. Left shin assessed. No bullseye noted. Velna HatchetSheila May, Np., made aware.  R: Pt compliant with tx.

## 2017-03-14 NOTE — Progress Notes (Signed)
[facility]  03/14/2017 9:46 AM Leonard Martinez  MRN:  [id number] Subjective:  Patient is frustrated with lack of sleep. Objective: Pt seen and chart reviewed. Pt is alert/oriented x4, calm, cooperative, and appropriate to situation. Pt denies homicidal ideation and psychosis and does not appear to be responding to internal stimuli.  Tick bite appears confined.  No erythematous spreading evident.  Continues to be lethargic during day due to lack of adequate sleep at night.  Principal Problem: MDD (major depressive disorder), recurrent, severe, with psychosis (HCC) Diagnosis:   Patient Active Problem List   Diagnosis Date Noted  . MDD (major depressive disorder), recurrent, severe, with psychosis (HCC) [F33.3] 03/13/2017  . Homelessness [Z59.0] 03/12/2017  . MDD (major depressive disorder), single episode, severe , no psychosis (HCC) [F32.2] 06/28/2015  . Cocaine use disorder, mild, abuse [F14.10] 06/28/2015  . Tobacco use disorder [F17.200] 06/28/2015  . Cannabis use disorder, severe, dependence (HCC) [F12.20] 06/28/2015  . Alcohol use disorder, moderate, in sustained remission (HCC) [F10.21] 06/28/2015  . Suicide attempt (HCC) [T14.91XA] 06/27/2015  . Substance induced mood disorder (HCC) [F19.94] 06/26/2015  . Salicylate overdose [T39.091A] 06/25/2015   Total Time spent with patient: 30 minutes  Past Psychiatric History: see h&P  Past Medical History:  Past Medical History:  Diagnosis Date  . Anxiety   . Chronic back pain   . Herniated disc   . Hyperlipidemia     Past Surgical History:  Procedure Laterality Date  . NO PAST SURGERIES     Family History:  Family History  Problem Relation Age of Onset  . Bladder Cancer Mother   . Pancreatic cancer Father    Family Psychiatric  History: see H&P Social History:  History  Alcohol Use  . Yes    Comment: As much as possible, varies with finances     History  Drug Use  . Types: Amphetamines, MDMA (Ecstacy),  Marijuana, Cocaine    Comment: intermittent depending on finances    Social History   Social History  . Marital status: Divorced    Spouse name: N/A  . Number of children: N/A  . Years of education: N/A   Social History Main Topics  . Smoking status: Current Every Day Smoker    Packs/day: 3.00    Years: 2.00    Types: Cigarettes  . Smokeless tobacco: Never Used  . Alcohol use Yes     Comment: As much as possible, varies with finances  . Drug use: Yes    Types: Amphetamines, MDMA (Ecstacy), Marijuana, Cocaine     Comment: intermittent depending on finances  . Sexual activity: Yes   Other Topics Concern  . None   Social History Narrative  . None   Additional Social History:    History of alcohol / drug use?: Yes Longest period of sobriety (when/how long): 12 years Negative Consequences of Use: Financial, Armed forces operational officerLegal, Personal relationships, Work / School Withdrawal Symptoms: Sweats, Tremors                    Sleep: Fair  Appetite:  Good  Current Medications: Current Facility-Administered Medications  Medication Dose Route Frequency Provider Last Rate Last Dose  . acetaminophen (TYLENOL) tablet 650 mg  650 mg Oral Q6H PRN Beau FannyWithrow, John C, FNP   650 mg at 03/13/17 1116  . alum & mag hydroxide-simeth (MAALOX/MYLANTA) 200-200-20 MG/5ML suspension 30 mL  30 mL Oral Q4H PRN Withrow, Everardo AllJohn C, FNP      . feeding supplement (ENSURE  ENLIVE) (ENSURE ENLIVE) liquid 237 mL  237 mL Oral Daily PRN Cobos, Fernando A, MD      . hydrOXYzine (ATARAX/VISTARIL) tablet 25 mg  25 mg Oral TID PRN Beau Fanny, FNP   25 mg at 03/13/17 1817  . loperamide (IMODIUM) capsule 2-4 mg  2-4 mg Oral PRN Withrow, Everardo All, FNP      . LORazepam (ATIVAN) tablet 1 mg  1 mg Oral Q6H PRN Beau Fanny, FNP   1 mg at 03/13/17 1740  . magnesium hydroxide (MILK OF MAGNESIA) suspension 30 mL  30 mL Oral Daily PRN Withrow, Everardo All, FNP      . [START ON 03/15/2017] meloxicam (MOBIC) tablet 15 mg  15 mg Oral  Daily Adonis Brook, NP      . multivitamin with minerals tablet 1 tablet  1 tablet Oral Daily Withrow, Everardo All, FNP   1 tablet at 03/14/17 [phone]  . nicotine (NICODERM CQ - dosed in mg/24 hours) patch 21 mg  21 mg Transdermal Daily Beau Fanny, FNP   21 mg at 03/14/17 0814  . ondansetron (ZOFRAN-ODT) disintegrating tablet 4 mg  4 mg Oral Q6H PRN Withrow, Everardo All, FNP      . penicillin v potassium (VEETID) tablet 500 mg  500 mg Oral Q6H Withrow, John C, FNP   500 mg at 03/14/17 1308  . QUEtiapine (SEROQUEL) tablet 25 mg  25 mg Oral QHS Adonis Brook, NP      . sertraline (ZOLOFT) tablet 25 mg  25 mg Oral Daily Beau Fanny, FNP   25 mg at 03/14/17 0814  . thiamine (VITAMIN B-1) tablet 100 mg  100 mg Oral Daily Beau Fanny, FNP   100 mg at 03/14/17 6578    Lab Results:  No results found for this or any previous visit (from the past 48 hour(s)).  Blood Alcohol level:  Lab Results  Component Value Date   ETH <5 06/25/2015    Metabolic Disorder Labs: No results found for: HGBA1C, MPG No results found for: PROLACTIN No results found for: CHOL, TRIG, HDL, CHOLHDL, VLDL, LDLCALC  Physical Findings: AIMS: Facial and Oral Movements Muscles of Facial Expression: None, normal Lips and Perioral Area: None, normal Jaw: None, normal Tongue: None, normal,Extremity Movements Upper (arms, wrists, hands, fingers): None, normal Lower (legs, knees, ankles, toes): None, normal, Trunk Movements Neck, shoulders, hips: None, normal, Overall Severity Severity of abnormal movements (highest score from questions above): None, normal Incapacitation due to abnormal movements: None, normal Patient's awareness of abnormal movements (rate only patient's report): No Awareness, Dental Status Current problems with teeth and/or dentures?: Yes Does patient usually wear dentures?: Yes  CIWA:  CIWA-Ar Total: 6 COWS:  COWS Total Score: 0  Musculoskeletal: Strength & Muscle Tone: within normal  limits Gait & Station: normal Patient leans: N/A  Psychiatric Specialty Exam: Physical Exam  Nursing note and vitals reviewed. Musculoskeletal:       Legs: Psychiatric: He exhibits a depressed mood.    Review of Systems  Constitutional: Negative.   HENT: Negative.   Eyes: Negative.   Respiratory: Negative.   Gastrointestinal: Negative.   Skin: Positive for rash (patient has tick bite, no rash evident).  Psychiatric/Behavioral: Positive for depression, hallucinations (improving), substance abuse and suicidal ideas (improving). The patient is nervous/anxious and has insomnia.   All other systems reviewed and are negative.   Blood pressure (!) 121/96, pulse 75, temperature 98.1 F (36.7 C), temperature source Oral, resp. rate 16, height  5\' 8"  (1.727 m), weight 76.7 kg (169 lb).Body mass index is 25.7 kg/m.  General Appearance: Casual and Fairly Groomed  Eye Contact:  Good  Speech:  Clear and Coherent and Normal Rate  Volume:  Normal  Mood:  Anxious and Depressed  Affect:  Appropriate and Congruent  Thought Process:  Coherent, Goal Directed, Linear and Descriptions of Associations: Loose  Orientation:  Full (Time, Place, and Person)  Thought Content:  Focused on tick, sleeping, anxiety  Suicidal Thoughts:  Yes.  without intent/plan  Homicidal Thoughts:  No  Memory:  Immediate;   Fair Recent;   Fair Remote;   Fair  Judgement:  Fair  Insight:  Fair  Psychomotor Activity:  Normal  Concentration:  Concentration: Fair and Attention Span: Fair  Recall:  [employer] of Knowledge:  Fair  Language:  Fair  Akathisia:  No  Handed:    AIMS (if indicated):     Assets:  Communication Skills Desire for Improvement Resilience Social Support  ADL's:  Intact  Cognition:  WNL  Sleep:  Number of Hours: 6.25   Treatment Plan Summary:  Reviewed 03/14/2017  reviewed medications below and concur with regimen with the following changes:  MDD (major depressive disorder), recurrent, severe,  with psychosis (HCC) with polysubstance abuse and substance-induced mood disorder, unstable, treated as below:   Medications: -Ativan (PRN only) CIWA protocol -Zoloft 25mg  po daily for depression, will titrate to 50mg  if tolerated -Rozerem 8mg  po qhs prn insomnia D/C, patient states that ineffective - Add Seroquel 25 mg nightly trial, for racing thoughts, insomnia -Nicotine patch -Increase Mobic to 15 mg po daily for chronic back pain -Give x1 dose of Doxycycline 200mg  per CDC Lyme prevention guidelines  Labs/tests/other: -Reviewed CBC, CMP, UDS, all WNL.  reviewed EKG, ordered TSH, lipid panel  Lindwood Qua, NP [facility] 03/14/2017, 9:46 AM

## 2017-03-14 NOTE — [facility] Group Notes (Signed)
[facility] LCSW Group Therapy  03/14/2017 1:15pm  Type of Therapy: Group Therapy   Topic: Overcoming Obstacles  Participation Level: Active  Participation Quality: Appropriate   Affect: Appropriate  Cognitive: Appropriate and Oriented  Insight: Developing/Improving and Improving  Engagement in Therapy: Improving  Modes of Intervention: Discussion, Exploration, Problem-solving and Support  Description of Group:  In this group patients will be encouraged to explore what they see as obstacles to their own wellness and recovery. They will be guided to discuss their thoughts, feelings, and behaviors related to these obstacles. The group will process together ways to cope with barriers, with attention given to specific choices patients can make. Each patient will be challenged to identify changes they are motivated to make in order to overcome their obstacles. This group will be process-oriented, with patients participating in exploration of their own experiences as well as giving and receiving support and challenge from other group members.  Summary of Patient Progress:  Pt states that his biggest obstacle is himself. Pt went on to explain that he was in recovery for 10 years but drank after someone at a bar offered him a drink for free. Pt states that his life has been a "downward spiral" since then. Pt seemed to be motivated to attempting recovery again. He reports that he knows he cannot do it on his own and is finally willing to accept help from others.   Therapeutic Modalities:  Cognitive Behavioral Therapy Solution Focused Therapy Motivational Interviewing Relapse Prevention Therapy  Jonathon JordanLynn B Brayan Votaw, MSW, Theresia MajorsLCSWA [phone]

## 2017-03-14 NOTE — Progress Notes (Signed)
Did not attend group 

## 2017-03-14 NOTE — Progress Notes (Signed)
Recreation Therapy Notes  Date: 03/14/17 Time: 0930 Location: [address] Dayroom  Group Topic: Stress Management  Goal Area(s) Addresses:  Patient will verbalize importance of using healthy stress management.  Patient will identify positive emotions associated with healthy stress management.   Intervention: Stress Management  Activity :  De-escalating Stress.  LRT introduced the stress management technique of meditation.  LRT played a meditation from the Calm app to help patients engage in the practice of de-escalating stress.  Patients were to follow along as the meditation played to participate in the technique.  Education:  Stress Management, Discharge Planning.   Education Outcome: Acknowledges edcuation/In group clarification offered/Needs additional education  Clinical Observations/Feedback: Pt did not attend group.    Caroll RancherMarjette Sudie Bandel, LRT/CTRS         Caroll RancherLindsay, Ferrell Claiborne A 03/14/2017 11:32 AM

## 2017-03-14 NOTE — [facility] Group Notes (Signed)
[facility] LCSW Aftercare Discharge Planning Group Note   03/14/2017  8:45 AM  Participation Quality: Pt invited. Did not attend.  Shone Leventhal B Rhydian Baldi, MSW, LCSWA 03/14/2017 1:13 PM   

## 2017-03-14 NOTE — Progress Notes (Signed)
NUTRITION ASSESSMENT  Pt identified as at risk on the Malnutrition Screen Tool  INTERVENTION: 1. Educated patient on the importance of nutrition and encouraged intake of food and beverages. 2. Discussed weight goals. 3. Supplements: will change Ensure Enlive order to once/day PRN, this supplement provides 350 kcal and 20 grams of protein.   NUTRITION DIAGNOSIS: Unintentional weight loss related to sub-optimal intake as evidenced by pt report.   Goal: Pt to meet >/= 90% of their estimated nutrition needs.  Monitor:  PO intake  Assessment:  Pt admitted for SI, HI, and auditory hallucinations that have been worsening over the past few months. He reports using alcohol and drugs to self-medicate x1 year and that frequency has increased.   Per review, no weight hx since 06/27/15 PTA. Pt weight recorded as 175 lbs on 5/4 and 169 lbs on 5/5. Unsure of which weight is accurate or if the 6 lb weight loss occurred PTA. A 6 lb weight loss would indicate 2.8% body weight loss.   Ensure Enlive has been ordered BID. Will decrease to once/day PRN. Continue to encourage PO intakes of meals and snacks.     46 y.o. male  Height: Ht Readings from Last 1 Encounters:  03/12/17 5\' 8"  (1.727 m)    Weight: Wt Readings from Last 1 Encounters:  03/12/17 169 lb (76.7 kg)    Weight Hx: Wt Readings from Last 10 Encounters:  03/12/17 169 lb (76.7 kg)  03/11/17 175 lb (79.4 kg)  06/27/15 165 lb (74.8 kg)  06/26/15 172 lb 2.9 oz (78.1 kg)  03/27/14 165 lb (74.8 kg)  08/19/13 183 lb 3.2 oz (83.1 kg)    BMI:  Body mass index is 25.7 kg/m. Pt meets criteria for overweight based on current BMI.  Estimated Nutritional Needs: Kcal: 25-30 kcal/kg Protein: > 1 gram protein/kg Fluid: 1 ml/kcal  Diet Order: Diet regular Room service appropriate? Yes; Fluid consistency: Thin Pt is also offered choice of unit snacks mid-morning and mid-afternoon.  Pt is eating as desired.   Lab results and  medications reviewed.     Trenton GammonJessica Valerya Maxton, MS, RD, LDN, [facility] Inpatient Clinical Dietitian Pager # [phone] After hours/weekend pager # [phone]

## 2017-03-15 LAB — TSH: TSH: 2.025 u[IU]/mL (ref 0.350–4.500)

## 2017-03-15 LAB — LIPID PANEL
CHOL/HDL RATIO: 6.4 ratio
Cholesterol: 286 mg/dL — ABNORMAL HIGH (ref 0–200)
HDL: 45 mg/dL (ref >40)
LDL Cholesterol: 219 mg/dL — ABNORMAL HIGH (ref 0–99)
TRIGLYCERIDES: 108 mg/dL (ref <150)
VLDL: 22 mg/dL (ref 0–40)

## 2017-03-15 MED ORDER — RISPERIDONE 0.5 MG PO TABS
0.5000 mg | ORAL_TABLET | Freq: Every day | ORAL | Status: DC
  Administered 2017-03-16: 0.5 mg via ORAL
  Filled 2017-03-15 (×2): qty 1

## 2017-03-15 MED ORDER — RISPERIDONE 1 MG PO TABS: 1.0000 mg | ORAL_TABLET | Freq: Every day | ORAL | Status: DC

## 2017-03-15 MED ORDER — RISPERIDONE 0.5 MG PO TABS
0.5000 mg | ORAL_TABLET | Freq: Every day | ORAL | Status: DC
  Filled 2017-03-15: qty 1

## 2017-03-15 MED ORDER — RISPERIDONE 0.5 MG PO TABS
0.5000 mg | ORAL_TABLET | Freq: Once | ORAL | Status: AC
  Administered 2017-03-15: 0.5 mg via ORAL
  Filled 2017-03-15: qty 1

## 2017-03-15 MED ORDER — QUETIAPINE FUMARATE 50 MG PO TABS
50.0000 mg | ORAL_TABLET | Freq: Every evening | ORAL | Status: DC | PRN
  Administered 2017-03-15: 50 mg via ORAL
  Filled 2017-03-15 (×6): qty 1

## 2017-03-15 NOTE — Progress Notes (Addendum)
D: Patient isolative to room, bed. Complains of chronic back pain at an 8/10. Does report seroquel was helpful for sleep last night. "For the first time in forever, I slept I do think a slight increase in the dose would help though. I woke up twice." Spoke with patient 1:1. Rating depression at an 8/10, hopelessness at a 9/10 and anxiety at a 9/10. Rates sleep as fair, appetite as fair, energy as low and concentration as poor. Patient's affect flat, anxious, sad with depressed and irritable mood. States goal for today is to "set up long term plan and speak with counselors." CIWA is a "5" at this time.   A: Medicated per orders, vistaril prn given. Scheduled mobic given for pain. Emotional support offered and self inventory reviewed. Encouraged completion of Suicide Safety Plan. Discussed POC with MD, SW. Fall precautions in place and reviewed.   R: Patient verbalizes understanding of POC. On reassess of anxiety, pain, patient is asleep. Patient endorsing AH and passive SI. Denies plan, intent and states, "I have hope. I feel guilty I relapsed and I wish my family understood addiction. But it's one day at a time and I know I have to forgive myself." Patient verbally contracts for safety and remains safe on level III obs. Denies HI. Will continue to monitor closely.  1100: No rash noted, bullseye or otherwise, observed on L shin.

## 2017-03-15 NOTE — [facility] Group Notes (Signed)
[facility] Group Notes:  (Nursing/MHT/Case Management/Adjunct)  Date:  03/15/2017  Time:  0900 am  Type of Therapy:  Mindfulness Meditation  Participation Level: Did not attend   Cranford MonBeaudry, Aren Pryde Evans 03/15/2017, 12:01 PM

## 2017-03-15 NOTE — [facility] Group Notes (Signed)
[facility] Mental Health Association Group Therapy 03/15/2017 1:15pm  Type of Therapy: Mental Health Association Presentation  Participation Level: Pt invited. Did not attend.  Vito BackersLynn B. Beverely PaceBryant, MSW, [facility]WA 03/15/2017 2:59 PM

## 2017-03-15 NOTE — Progress Notes (Signed)
Recreation Therapy Notes  Animal-Assisted Activity (AAA) Program Checklist/Progress Notes Patient Eligibility Criteria Checklist & Daily Group note for Rec TxIntervention  Date: 05.08.2018 Time: 2:45pm Location: [address] Dayroom   AAA/T Program Assumption of Risk Form signed by Patient/ or Parent Legal Guardian Yes  Patient is free of allergies or sever asthma Yes  Patient reports no fear of animals Yes  Patient reports no history of cruelty to animals Yes  Patient understands his/her participation is voluntary Yes  Patient washes hands before animal contact Yes  Patient washes hands after animal contact Yes  Behavioral Response: Engaged, Attentive   Education:Hand Washing, Appropriate Animal Interaction   Education Outcome: Acknowledges education.   Clinical Observations/Feedback: Patient attended session and interacted appropriately with therapy dog and peers.   Robinson Brinkley L Infantof Villagomez, LRT/CTRS        Daylani Deblois L 03/15/2017 3:07 PM 

## 2017-03-15 NOTE — Progress Notes (Signed)
Leonard Martinez. Mattheo had been up and visible in milieu this evening, spoke about feeling anxious and agitated and spoke about how no one is listening to him about not being able to sleep. Maisie Fushomas was informed and educated about seroquel that he received this evening. He also did attend AA group this evening and spoke about how the group was positive and went well. He did receive all bedtime medications without incident. A. Support and encouragement provided. R. Safety maintained, will continue to monitor.

## 2017-03-15 NOTE — Plan of Care (Signed)
Problem: Education: Goal: Verbalization of understanding the information provided will improve Outcome: Progressing Patient verbalizes understanding of education, information provided.  Problem: Safety: Goal: Ability to disclose and discuss suicidal ideas will improve Outcome: Progressing Patient forthcoming about present passive SI. No plan, intent and verbally contracts for safety.

## 2017-03-15 NOTE — Progress Notes (Signed)
[facility] MD Progress Note  03/15/2017 5:29 PM Leonard Martinez  MRN:  [id number] Subjective:   46 yo Caucasian male, divorced. Background history of SUD. Had been sober for ten years. Recently relapsed. Has been using alcohol, stimulants and THC. Admitted on account of suicidal thoughts. Was internally stimulated at presentation. UDS was positive for cocaine and THC.   Chart reviewed today. Patient discussed at team.  Staff reports that he has been isolating self in his room. He appears distressed. He reports auditory hallucinations. He has not been able to sleep well at night. He has not been able to engage at groups.  Seen today. In bed. Says he still hears voices. Cannot make out what they are saying. Very distressed and continues to have suicidal thoughts. No plans to acti here. Feels he let himself down again. Negative ruminations about how he has messed up over and over. No interest in doing anything.  We discussed switching quetiapine to a more potent antipsychotic agent. Risperidone was discussed. He consented to treatment after we reviewed the risks and benefits.  Principal Problem:  Substance Induced Psychotic Disorder Diagnosis:   Patient Active Problem List   Diagnosis Date Noted  . MDD (major depressive disorder), recurrent, severe, with psychosis (HCC) [F33.3] 03/13/2017  . Homelessness [Z59.0] 03/12/2017  . MDD (major depressive disorder), single episode, severe , no psychosis (HCC) [F32.2] 06/28/2015  . Cocaine use disorder, mild, abuse [F14.10] 06/28/2015  . Tobacco use disorder [F17.200] 06/28/2015  . Cannabis use disorder, severe, dependence (HCC) [F12.20] 06/28/2015  . Alcohol use disorder, moderate, in sustained remission (HCC) [F10.21] 06/28/2015  . Suicide attempt (HCC) [T14.91XA] 06/27/2015  . Substance induced mood disorder (HCC) [F19.94] 06/26/2015  . Salicylate overdose [T39.091A] 06/25/2015   Total Time spent with patient: 20 minutes  Past Psychiatric History: As in  H&P  Past Medical History:  Past Medical History:  Diagnosis Date  . Anxiety   . Chronic back pain   . Herniated disc   . Hyperlipidemia     Past Surgical History:  Procedure Laterality Date  . NO PAST SURGERIES     Family History:  Family History  Problem Relation Age of Onset  . Bladder Cancer Mother   . Pancreatic cancer Father    Family Psychiatric  History: As in H&P Social History:  History  Alcohol Use  . Yes    Comment: As much as possible, varies with finances     History  Drug Use  . Types: Amphetamines, MDMA (Ecstacy), Marijuana, Cocaine    Comment: intermittent depending on finances    Social History   Social History  . Marital status: Divorced    Spouse name: N/A  . Number of children: N/A  . Years of education: N/A   Social History Main Topics  . Smoking status: Current Every Day Smoker    Packs/day: 3.00    Years: 2.00    Types: Cigarettes  . Smokeless tobacco: Never Used  . Alcohol use Yes     Comment: As much as possible, varies with finances  . Drug use: Yes    Types: Amphetamines, MDMA (Ecstacy), Marijuana, Cocaine     Comment: intermittent depending on finances  . Sexual activity: Yes   Other Topics Concern  . None   Social History Narrative  . None   Additional Social History:    History of alcohol / drug use?: Yes Longest period of sobriety (when/how long): 12 years Negative Consequences of Use: Financial, Legal, Personal relationships, Work /  School Withdrawal Symptoms: Sweats, Tremors         Sleep: Poor  Appetite:  Poor  Current Medications: Current Facility-Administered Medications  Medication Dose Route Frequency Provider Last Rate Last Dose  . acetaminophen (TYLENOL) tablet 650 mg  650 mg Oral Q6H PRN Beau Fanny, FNP   650 mg at 03/15/17 1651  . alum & mag hydroxide-simeth (MAALOX/MYLANTA) 200-200-20 MG/5ML suspension 30 mL  30 mL Oral Q4H PRN Withrow, Everardo All, FNP      . feeding supplement (ENSURE ENLIVE)  (ENSURE ENLIVE) liquid 237 mL  237 mL Oral Daily PRN Cobos, Rockey Situ, MD   237 mL at 03/15/17 1652  . hydrOXYzine (ATARAX/VISTARIL) tablet 25 mg  25 mg Oral TID PRN Beau Fanny, FNP   25 mg at 03/15/17 1651  . loperamide (IMODIUM) capsule 2-4 mg  2-4 mg Oral PRN Withrow, Everardo All, FNP      . LORazepam (ATIVAN) tablet 1 mg  1 mg Oral Q6H PRN Beau Fanny, FNP   1 mg at 03/15/17 0422  . magnesium hydroxide (MILK OF MAGNESIA) suspension 30 mL  30 mL Oral Daily PRN Withrow, John C, FNP      . meloxicam (MOBIC) tablet 15 mg  15 mg Oral Daily Adonis Brook, NP   15 mg at 03/15/17 1610  . multivitamin with minerals tablet 1 tablet  1 tablet Oral Daily Beau Fanny, FNP   1 tablet at 03/15/17 9604  . nicotine (NICODERM CQ - dosed in mg/24 hours) patch 21 mg  21 mg Transdermal Daily Beau Fanny, FNP   21 mg at 03/15/17 5409  . ondansetron (ZOFRAN-ODT) disintegrating tablet 4 mg  4 mg Oral Q6H PRN Withrow, Everardo All, FNP      . penicillin v potassium (VEETID) tablet 500 mg  500 mg Oral Q6H Withrow, John C, FNP   500 mg at 03/15/17 1651  . QUEtiapine (SEROQUEL) tablet 25 mg  25 mg Oral QHS Adonis Brook, NP   25 mg at 03/14/17 2129  . sertraline (ZOLOFT) tablet 25 mg  25 mg Oral Daily Beau Fanny, FNP   25 mg at 03/15/17 8119  . thiamine (VITAMIN B-1) tablet 100 mg  100 mg Oral Daily Beau Fanny, FNP   100 mg at 03/15/17 1478    Lab Results:  Results for orders placed or performed during the hospital encounter of 03/12/17 (from the past 48 hour(s))  TSH     Status: None   Collection Time: 03/15/17  6:05 AM  Result Value Ref Range   TSH 2.025 0.350 - 4.500 uIU/mL    Comment: Performed by a 3rd Generation assay with a functional sensitivity of <=0.01 uIU/mL. Performed at [facility], [address], [place], [place] [address]   Lipid panel     Status: Abnormal   Collection Time: 03/15/17  6:05 AM  Result Value Ref Range   Cholesterol 286 (H) 0 - 200 mg/dL    Triglycerides 130 <865 mg/dL   HDL 45 >78 mg/dL   Total CHOL/HDL Ratio 6.4 RATIO   VLDL 22 0 - 40 mg/dL   LDL Cholesterol 469 (H) 0 - 99 mg/dL    Comment:        Total Cholesterol/HDL:CHD Risk Coronary Heart Disease Risk Table                     Men   Women  1/2 Average Risk   3.4  3.3  Average Risk       5.0   4.4  2 X Average Risk   9.6   7.1  3 X Average Risk  23.4   11.0        Use the calculated Patient Ratio above and the CHD Risk Table to determine the patient's CHD Risk.        ATP III CLASSIFICATION (LDL):  <100     mg/dL   Optimal  [id number]  mg/dL   Near or Above                    Optimal  130-159  mg/dL   Borderline  [id number]  mg/dL   High  >811>190     mg/dL   Very High Performed at [facility], [address] [place], [place] [address]     Blood Alcohol level:  Lab Results  Component Value Date   ETH <5 06/25/2015    Metabolic Disorder Labs: No results found for: HGBA1C, MPG No results found for: PROLACTIN Lab Results  Component Value Date   CHOL 286 (H) 03/15/2017   TRIG 108 03/15/2017   HDL 45 03/15/2017   CHOLHDL 6.4 03/15/2017   VLDL 22 03/15/2017   LDLCALC 219 (H) 03/15/2017    Physical Findings: AIMS: Facial and Oral Movements Muscles of Facial Expression: None, normal Lips and Perioral Area: None, normal Jaw: None, normal Tongue: None, normal,Extremity Movements Upper (arms, wrists, hands, fingers): None, normal Lower (legs, knees, ankles, toes): None, normal, Trunk Movements Neck, shoulders, hips: None, normal, Overall Severity Severity of abnormal movements (highest score from questions above): None, normal Incapacitation due to abnormal movements: None, normal Patient's awareness of abnormal movements (rate only patient's report): No Awareness, Dental Status Current problems with teeth and/or dentures?: Yes Does patient usually wear dentures?: Yes  CIWA:  CIWA-Ar Total: 8 COWS:  COWS Total Score:  0  Musculoskeletal: Strength & Muscle Tone: within normal limits Gait & Station: normal Patient leans: N/A  Psychiatric Specialty Exam: Physical Exam  Constitutional: He is oriented to person, place, and time. He appears well-nourished.  HENT:  Head: Normocephalic and atraumatic.  Eyes: Conjunctivae are normal. Pupils are equal, round, and reactive to light.  Neck: Normal range of motion. Neck supple.  Cardiovascular: Normal rate and regular rhythm.   Respiratory: Effort normal and breath sounds normal.  GI: Soft. Bowel sounds are normal.  Musculoskeletal: Normal range of motion.  Neurological: He is alert and oriented to person, place, and time.  Skin: Skin is warm and dry.  Psychiatric:  As above.     ROS  Blood pressure 133/78, pulse 71, temperature 97.8 F (36.6 C), temperature source Oral, resp. rate 18, height 5\' 8"  (1.727 m), weight 76.7 kg (169 lb).Body mass index is 25.7 kg/m.  General Appearance: In hospital clothing, in bed. Seems to be in distress. Internally distracted.   Eye Contact:  Minimal  Speech:  Clear and Coherent  Volume:  Normal  Mood:  Dysphoric  Affect:  Congruent  Thought Process:  Linear  Orientation:  Full (Time, Place, and Person)  Thought Content:  Negative ruminations. Visual and auditory hallucinations  Suicidal Thoughts:  Yes.  without intent/plan  Homicidal Thoughts:  No  Memory:  Impaired   Judgement:  Impaired  Insight:  Good  Psychomotor Activity:  Decreased  Concentration:  Impaired   Recall:  Did not assess  Fund of Knowledge:  Did not assess  Language:  Good  Akathisia:  No  Handed:    AIMS (if indicated):     Assets:  Desire for Improvement  ADL's:  Impaired  Cognition:  Impaired,  Mild  Sleep:  Number of Hours: 5.75     Treatment Plan Summary: Patient is psychotic and dysphoric. He remains at risk to self. He has consented to medication adjustment as below.    Psychiatric: Substance induced psychotic  disorder SUD  Medical:  Psychosocial:   PLAN: 1.  Risperidone 0.5 mg daily, 1 mg HS   2.  Hold Seroquel 3. Continue to monitor mood, behavior and interaction with peers 4. Encourage  unit groups as he gets better.  5. SW would coordinate aftercare    Georgiann Cocker, MD 03/15/2017, 5:29 PM

## 2017-03-16 MED ORDER — QUETIAPINE FUMARATE 100 MG PO TABS
100.0000 mg | ORAL_TABLET | Freq: Every evening | ORAL | Status: DC | PRN
  Administered 2017-03-16 – 2017-03-19 (×7): 100 mg via ORAL
  Filled 2017-03-16 (×14): qty 1

## 2017-03-16 MED ORDER — NICOTINE 14 MG/24HR TD PT24
14.0000 mg | MEDICATED_PATCH | Freq: Every day | TRANSDERMAL | Status: DC
  Administered 2017-03-17 – 2017-03-20 (×4): 14 mg via TRANSDERMAL
  Filled 2017-03-16 (×6): qty 1

## 2017-03-16 NOTE — Progress Notes (Signed)
Leonard Martinez. Manson had been up visible in milieu, seen as anxious and agitated early in the shift and did receive PRN medication to assist. Maisie Fushomas was concerned about medications and reports some type of misunderstanding when he spoke with doctor earlier. He spoke about seroquel being beneficial to him as he reports that he had been on risperdal in the past while he was here and it did not help him. Night time PA made aware of patient concerns and medication was addressed accordingly. Maisie Fushomas did not receive risperdal this evening and instead received seroquel and was appreciative of the effort made to address his concerns. A. Support provided, medication education given. Glenard Haring. Allan verbalized understanding, safety maintained.

## 2017-03-16 NOTE — Progress Notes (Signed)
Pt attended NA group this evening.  

## 2017-03-16 NOTE — Progress Notes (Signed)
D: Patient up and visible in the milieu when he has needs but otherwise can be isolative. Patient did attend morning discharge planning group. States his intent is to attend groups today. Spoke with patient 1:1. Rating depression at an 8/10, hopelessness at an 8/10 and anxiety at a 9/10. Rates sleep as good, appetite as fair, energy as normal and concentration as poor. Patient's affect anxious, mood agitated. States goal for today is to "long term treatment, talk with social worker." Complaining of chronic back pain of an 8/10, headache of a 9/10. CIWA is an "11" - agitated, anxious, shaky with worsening headache. VSS.  A: Medicated per orders, ativan, vistaril and tylenol prn given. Emotional support offered and self inventory reviewed. Encouraged completion of Suicide Safety Plan. Discussed POC with MD, SW.  Fall prevention plan in place and reviewed.   R: Patient verbalizes understanding of POC, fall plan. On reassess, patient still reports pain, anxiety. Patient reports AH that put him down. "They tell me I'm no good, worthless. It's hard because I already feel shame around my relapse." Patient endorsing passive SI but denies plan, intent. Reports having hope for recovery as he had 10 years of sobriety before. No HI and remains safe on level III obs. Will continue to monitor and support.

## 2017-03-16 NOTE — [facility] Group Notes (Signed)
[facility] LCSW Group Therapy 03/16/2017 1:15 PM  Type of Therapy: Group Therapy- Emotion Regulation  Participation Level: Active   Participation Quality:  Appropriate  Affect: Appropriate  Cognitive: Alert and Oriented   Insight:  Developing/Improving  Engagement in Therapy: Developing/Improving and Engaged   Modes of Intervention: Clarification, Confrontation, Discussion, Education, Exploration, Limit-setting, Orientation, Problem-solving, Rapport Building, Dance movement psychotherapisteality Testing, Socialization and Support  Summary of Progress/Problems: The topic for group today was emotional regulation. This group focused on both positive and negative emotion identification and allowed group members to process ways to identify feelings, regulate negative emotions, and find healthy ways to manage internal/external emotions. Group members were asked to reflect on a time when their reaction to an emotion led to a negative outcome and explored how alternative responses using emotion regulation would have benefited them. Group members were also asked to discuss a time when emotion regulation was utilized when a negative emotion was experienced. Pt identified love as an emotion that he has a difficult time regulating. When asked to expound on this pt states "When I get drunk I love everyone".   Jonathon JordanLynn B Renee Erb, MSW, LCSWA 03/16/2017 3:09 PM

## 2017-03-16 NOTE — Progress Notes (Signed)
[facility] MD Progress Note  03/16/2017 12:10 PM Leonard Martinez  MRN:  [id number] Subjective:   46 yo Caucasian male, unemployed, homeless and divorced. Background history of SUD. Had been sober for ten years. Recently relapsed. Has been using alcohol, stimulants and THC. Admitted on account of suicidal thoughts. Was internally stimulated at presentation. UDS was positive for cocaine and THC.   Chart reviewed today. Patient discussed at team.  Staff reports that he groomed self today. He was at group this morning. He requested to get back on Seroquel last night. Responded well to 50 mg. Still reports hallucinations. Much less internally distracted. Suicidal thoughts are less.  Seen today. Up and about. Says he feels better. Says the voices are quieter. Voices are critical and brings him down " you are a bad person ,,,,, you don't take care of your children ,, you ar better off dead".  Says he separated from his wife two years ago. They have four children. The three minors lives with his ex-wife. Says he hates her for keeping the children away from him. He has had times he wishes she was dead. Says he has no intention of harming her. Describes her as a good mother. Says she is raising their kids well. Patient says loves his kids and he would never do anything to hurt them. Patient states that he get two hours of supervised visit as stipulated by the courts. Says he is motivated to treat his addiction. He has been exploring rehab with his SW. Says he does not have [place] identity. His options for rehab is to do 90 days at [place]. He was linked to AA over the years. He loves AA and plans to reengage with them. He lost his job in March. Says he wants to stay clean before getting back into the work force. Says alcohol is his real drug of choice. While disinhibited by this, he then does cocaine. Patient feels he does not need anti-craving medication.  Suicidal thoughts are less. He is optimistic about the future. No legal  issues. No other stressors. We have agreed to titrate Seroquel tonight.  Principal Problem:  Substance Induced Psychotic Disorder Diagnosis:   Patient Active Problem List   Diagnosis Date Noted  . MDD (major depressive disorder), recurrent, severe, with psychosis (HCC) [F33.3] 03/13/2017  . Homelessness [Z59.0] 03/12/2017  . MDD (major depressive disorder), single episode, severe , no psychosis (HCC) [F32.2] 06/28/2015  . Cocaine use disorder, mild, abuse [F14.10] 06/28/2015  . Tobacco use disorder [F17.200] 06/28/2015  . Cannabis use disorder, severe, dependence (HCC) [F12.20] 06/28/2015  . Alcohol use disorder, moderate, in sustained remission (HCC) [F10.21] 06/28/2015  . Suicide attempt (HCC) [T14.91XA] 06/27/2015  . Substance induced mood disorder (HCC) [F19.94] 06/26/2015  . Salicylate overdose [T39.091A] 06/25/2015   Total Time spent with patient: 20 minutes  Past Psychiatric History: As in H&P  Past Medical History:  Past Medical History:  Diagnosis Date  . Anxiety   . Chronic back pain   . Herniated disc   . Hyperlipidemia     Past Surgical History:  Procedure Laterality Date  . NO PAST SURGERIES     Family History:  Family History  Problem Relation Age of Onset  . Bladder Cancer Mother   . Pancreatic cancer Father    Family Psychiatric  History: As in H&P Social History:  History  Alcohol Use  . Yes    Comment: As much as possible, varies with finances     History  Drug  Use  . Types: Amphetamines, MDMA (Ecstacy), Marijuana, Cocaine    Comment: intermittent depending on finances    Social History   Social History  . Marital status: Divorced    Spouse name: N/A  . Number of children: N/A  . Years of education: N/A   Social History Main Topics  . Smoking status: Current Every Day Smoker    Packs/day: 3.00    Years: 2.00    Types: Cigarettes  . Smokeless tobacco: Never Used  . Alcohol use Yes     Comment: As much as possible, varies with finances   . Drug use: Yes    Types: Amphetamines, MDMA (Ecstacy), Marijuana, Cocaine     Comment: intermittent depending on finances  . Sexual activity: Yes   Other Topics Concern  . None   Social History Narrative  . None   Additional Social History:    History of alcohol / drug use?: Yes Longest period of sobriety (when/how long): 12 years Negative Consequences of Use: Financial, Armed forces operational officer, Personal relationships, Work / School Withdrawal Symptoms: Sweats, Tremors         Sleep: Poor  Appetite:  Poor  Current Medications: Current Facility-Administered Medications  Medication Dose Route Frequency Provider Last Rate Last Dose  . acetaminophen (TYLENOL) tablet 650 mg  650 mg Oral Q6H PRN Beau Fanny, FNP   650 mg at 03/16/17 0949  . alum & mag hydroxide-simeth (MAALOX/MYLANTA) 200-200-20 MG/5ML suspension 30 mL  30 mL Oral Q4H PRN Withrow, Everardo All, FNP      . feeding supplement (ENSURE ENLIVE) (ENSURE ENLIVE) liquid 237 mL  237 mL Oral Daily PRN Cobos, Rockey Situ, MD   237 mL at 03/15/17 1652  . hydrOXYzine (ATARAX/VISTARIL) tablet 25 mg  25 mg Oral TID PRN Beau Fanny, FNP   25 mg at 03/16/17 0949  . loperamide (IMODIUM) capsule 2-4 mg  2-4 mg Oral PRN Withrow, Everardo All, FNP      . LORazepam (ATIVAN) tablet 1 mg  1 mg Oral Q6H PRN Beau Fanny, FNP   1 mg at 03/16/17 0805  . magnesium hydroxide (MILK OF MAGNESIA) suspension 30 mL  30 mL Oral Daily PRN Withrow, John C, FNP      . meloxicam (MOBIC) tablet 15 mg  15 mg Oral Daily Adonis Brook, NP   15 mg at 03/16/17 0803  . multivitamin with minerals tablet 1 tablet  1 tablet Oral Daily Beau Fanny, FNP   1 tablet at 03/16/17 0804  . [START ON 03/17/2017] nicotine (NICODERM CQ - dosed in mg/24 hours) patch 14 mg  14 mg Transdermal Daily Cobos, Fernando A, MD      . ondansetron (ZOFRAN-ODT) disintegrating tablet 4 mg  4 mg Oral Q6H PRN Withrow, Everardo All, FNP      . penicillin v potassium (VEETID) tablet 500 mg  500 mg Oral Q6H  Withrow, John C, FNP   500 mg at 03/16/17 1610  . QUEtiapine (SEROQUEL) tablet 50 mg  50 mg Oral QHS,MR X 1 Donell Sievert E, PA-C   50 mg at 03/15/17 2210  . risperiDONE (RISPERDAL) tablet 0.5 mg  0.5 mg Oral Daily Zamyiah Tino, Delight Ovens, MD   0.5 mg at 03/16/17 0804  . sertraline (ZOLOFT) tablet 25 mg  25 mg Oral Daily Beau Fanny, FNP   25 mg at 03/16/17 9604  . thiamine (VITAMIN B-1) tablet 100 mg  100 mg Oral Daily Withrow, John C, FNP   100 mg at  03/16/17 0804    Lab Results:  Results for orders placed or performed during the hospital encounter of 03/12/17 (from the past 48 hour(s))  TSH     Status: None   Collection Time: 03/15/17  6:05 AM  Result Value Ref Range   TSH 2.025 0.350 - 4.500 uIU/mL    Comment: Performed by a 3rd Generation assay with a functional sensitivity of <=0.01 uIU/mL. Performed at [facility], [address] [place], [place] [address]   Lipid panel     Status: Abnormal   Collection Time: 03/15/17  6:05 AM  Result Value Ref Range   Cholesterol 286 (H) 0 - 200 mg/dL   Triglycerides 604 <540 mg/dL   HDL 45 >98 mg/dL   Total CHOL/HDL Ratio 6.4 RATIO   VLDL 22 0 - 40 mg/dL   LDL Cholesterol 119 (H) 0 - 99 mg/dL    Comment:        Total Cholesterol/HDL:CHD Risk Coronary Heart Disease Risk Table                     Men   Women  1/2 Average Risk   3.4   3.3  Average Risk       5.0   4.4  2 X Average Risk   9.6   7.1  3 X Average Risk  23.4   11.0        Use the calculated Patient Ratio above and the CHD Risk Table to determine the patient's CHD Risk.        ATP III CLASSIFICATION (LDL):  <100     mg/dL   Optimal  147-829  mg/dL   Near or Above                    Optimal  130-159  mg/dL   Borderline  562-130  mg/dL   High  >865     mg/dL   Very High Performed at [facility], [address] [place], [place] [address]     Blood Alcohol level:  Lab Results  Component Value Date   ETH <5 06/25/2015    Metabolic  Disorder Labs: No results found for: HGBA1C, MPG No results found for: PROLACTIN Lab Results  Component Value Date   CHOL 286 (H) 03/15/2017   TRIG 108 03/15/2017   HDL 45 03/15/2017   CHOLHDL 6.4 03/15/2017   VLDL 22 03/15/2017   LDLCALC 219 (H) 03/15/2017    Physical Findings: AIMS: Facial and Oral Movements Muscles of Facial Expression: None, normal Lips and Perioral Area: None, normal Jaw: None, normal Tongue: None, normal,Extremity Movements Upper (arms, wrists, hands, fingers): None, normal Lower (legs, knees, ankles, toes): None, normal, Trunk Movements Neck, shoulders, hips: None, normal, Overall Severity Severity of abnormal movements (highest score from questions above): None, normal Incapacitation due to abnormal movements: None, normal Patient's awareness of abnormal movements (rate only patient's report): No Awareness, Dental Status Current problems with teeth and/or dentures?: Yes Does patient usually wear dentures?: Yes  CIWA:  CIWA-Ar Total: 11 COWS:  COWS Total Score: 0  Musculoskeletal: Strength & Muscle Tone: within normal limits Gait & Station: normal Patient leans: N/A  Psychiatric Specialty Exam: Physical Exam  Constitutional: He is oriented to person, place, and time. He appears well-nourished.  HENT:  Head: Normocephalic and atraumatic.  Eyes: Conjunctivae are normal. Pupils are equal, round, and reactive to light.  Neck: Normal range of motion. Neck supple.  Cardiovascular:  Normal rate and regular rhythm.   Respiratory: Effort normal and breath sounds normal.  GI: Soft. Bowel sounds are normal.  Musculoskeletal: Normal range of motion.  Neurological: He is alert and oriented to person, place, and time.  Skin: Skin is warm and dry.  Psychiatric:  As above.     ROS  Blood pressure 116/74, pulse 85, temperature 98.2 F (36.8 C), temperature source Oral, resp. rate 18, height 5\' 8"  (1.727 m), weight 76.7 kg (169 lb).Body mass index is 25.7  kg/m.  General Appearance: Groomed and neatly dressed today. Pleasant and able to engage. Not distracted by internal stimuli.    Eye Contact:  Good  Speech:  Spontaneous. Normal rate and tone  Volume:  Soft spoken  Mood:  Feels better  Affect:  Restricted but appropriate  Thought Process:  Linear  Orientation:  Full (Time, Place, and Person)  Thought Content:  Future oriented. No delusional theme. No preoccupation with violent thoughts. Less negative ruminations. No obsession.  No visual hallucinations. Auditory hallucinations are less.   Suicidal Thoughts:  Much less  Homicidal Thoughts:  No  Memory:  WNL  Judgement:  Better  Insight:  Good  Psychomotor Activity:  Better  Concentration:  WNL   Recall:  Good  Fund of Knowledge:  Good  Language:  Good  Akathisia:  No  Handed:    AIMS (if indicated):     Assets:  Desire for Improvement  ADL's:  Impaired  Cognition:  Impaired,  Mild  Sleep:  Number of Hours: 6.5     Treatment Plan Summary: Patient has made very remarkable improvement. Hallucinations are less. No longer dysphoric. This is typical with substance related psychosis. We plan to adjust his medications further today. Hopeful discharge in a couple of days     Psychiatric: Substance induced psychotic disorder SUD  Medical:  Psychosocial:  Divorced  Separated from his kids Lost his driving license Unemployed Homelessness No confiding relationship.  PLAN: 1. Discontinue Risperidone   2. Seroquel 100 mg HS 3. Continue to monitor mood, behavior and interaction with peers 4. Encourage  unit groups  5. SW would coordinate inpatient addiction treatment   Georgiann CockerVincent A Angelos Wasco, MD 03/16/2017, 12:10 PMPatient ID: Leonard Martinez, male   DOB: 01-18-1971, 46 y.o.   MRN: [id number]

## 2017-03-16 NOTE — Progress Notes (Signed)
Recreation Therapy Notes  Date: 03/16/17 Time: 0930 Location: [address] Dayroom  Group Topic: Stress Management  Goal Area(s) Addresses:  Patient will verbalize importance of using healthy stress management.  Patient will identify positive emotions associated with healthy stress management.   Intervention: Stress Management  Activity :  Guided Imagery.  LRT introduced the stress management technique of guided imagery.  LRT read a script to allow patients to engage in the technique.  Patients were to follow along as the script was read to participate.  Education:  Stress Management, Discharge Planning.   Education Outcome: Acknowledges edcuation/In group clarification offered/Needs additional education  Clinical Observations/Feedback: Pt did not attend group.   Caroll RancherMarjette Bonna Steury, LRT/CTRS         Caroll RancherLindsay, Shania Bjelland A 03/16/2017 12:59 PM

## 2017-03-16 NOTE — [facility] Group Notes (Signed)
[facility]   03/16/2017  8:45 AM  Participation Quality:  Active   Mood/Affect:  Irritable  Depression Rating: 9  Anxiety Rating: 9  Thoughts of Suicide:  Yes  Current AVH:  Yes  Plan for Discharge/Comments:  Pt states that he is not feeling well today. Pt reports hearing voices that tell him that he's worthless and "no good". Pt endorses AH however, does not seem to be responding to internal stimuli. No thought blocking, flat affect, or unsolicited talking noted. Pt was also very frustrated about his medications and states that he hasn't been sleeping well.  Transportation Means: Pt has access to transporation  Supports: Mental health supports   Jonathon JordanLynn B Graziella Connery, MSW, [facility]WA 03/16/2017 3:06 PM

## 2017-03-16 NOTE — Plan of Care (Signed)
Problem: Education: Goal: Knowledge of disease or condition will improve Outcome: Progressing Patient utilizing AA book, discussing the disease process of addiction.  Problem: Coping: Goal: Ability to verbalize feelings will improve Outcome: Progressing Patient disclosing, open about ongoing symptoms - AH, passive SI.

## 2017-03-17 MED ORDER — LORAZEPAM 1 MG PO TABS
1.0000 mg | ORAL_TABLET | Freq: Four times a day (QID) | ORAL | Status: DC | PRN
  Administered 2017-03-17 – 2017-03-22 (×13): 1 mg via ORAL
  Filled 2017-03-17 (×14): qty 1

## 2017-03-17 MED ORDER — LORAZEPAM 1 MG PO TABS
1.0000 mg | ORAL_TABLET | Freq: Once | ORAL | Status: AC
  Administered 2017-03-17: 1 mg via ORAL

## 2017-03-17 MED ORDER — QUETIAPINE FUMARATE 25 MG PO TABS
12.5000 mg | ORAL_TABLET | Freq: Every day | ORAL | Status: DC | PRN
  Administered 2017-03-19 – 2017-03-20 (×2): 12.5 mg via ORAL
  Filled 2017-03-17 (×2): qty 1

## 2017-03-17 MED ORDER — QUETIAPINE 12.5 MG HALF TABLET
12.5000 mg | ORAL_TABLET | Freq: Once | ORAL | Status: AC
  Administered 2017-03-17: 12.5 mg via ORAL
  Filled 2017-03-17: qty 1

## 2017-03-17 MED ORDER — LORAZEPAM 1 MG PO TABS
ORAL_TABLET | ORAL | Status: AC
  Filled 2017-03-17: qty 1

## 2017-03-17 NOTE — Progress Notes (Signed)
D:  Patient's self inventory sheet, patient has poor sleep, sleep medication not helpful.  Fair appetite, low energy level, poor concentration.  Rated depression, hopeless and anxiety #9.  Withdrawals, cravings, cramping, agitation, irritability.  SI, almost all the time.  Physical problems, pain, joint pain, back, neck, shoulders.  Physical pain, joints, back, neck, shoulders.  Pain medication not helpful.  Goal is long term treatment.  Plans to talk to SW.  No discharge plans. A:  Medications administered per MD orders.  Emotional support and encouragement given patient. R:  Denied visual hallucinations.  Stated he does have SI thoughts, contracts for safety.  "Want to hurt doctor because he stopped my ativan. need stronger pain medication and more seroquel."   MD informed of patient's statements.  Charge nurse and MHT's informed of patient's statement and attitude. Safety maintained with 15 minute checks. MD ordered seroquel 12.5 mg for patient this morning.

## 2017-03-17 NOTE — Progress Notes (Signed)
[facility] MD Progress Note  03/17/2017 7:23 PM Leonard Martinez  MRN:  [id number] Subjective:   46 yo Caucasian male, unemployed, homeless and divorced. Background history of SUD. Had been sober for ten years. Recently relapsed. Has been using alcohol, stimulants and THC. Admitted on account of suicidal thoughts. Was internally stimulated at presentation. UDS was positive for cocaine and THC.   Chart reviewed today. Patient discussed at team.  Staff reports that he was anxious this morning because he did not get Ativan. CIWA protocol has fallen off. He was very upset and threatening. He was given 12.5 mg of Seroquel. Had some effect. He attended unit groups and activities. More irritated throughout the day.  I met with him later in the day. He requested for his nurse to be present as he felt there was some miscommunication. Reports that he is still coming off alcohol. Says he has headaches, he has been nauseous. Says he has been cramping and very anxious. We agreed on stat dose of Ativan 1 mg. He understands that CIWA would be periodic and treatment would be based on objective scale. Would continue with Seroquel on PRN basis during the day.  Patient is still focused on inpatient rehab. Has some hope that he might be accepted at [facility].  Says he felt better after the interview. No evidence of psychosis. No evidence of mania. No homicidal thoughts.   Principal Problem:  Substance Induced Psychotic Disorder Diagnosis:   Patient Active Problem List   Diagnosis Date Noted  . MDD (major depressive disorder), recurrent, severe, with psychosis ([place]) [F33.3] 03/13/2017  . Homelessness [Z59.0] 03/12/2017  . MDD (major depressive disorder), single episode, severe , no psychosis ([place]) [F32.2] 06/28/2015  . Cocaine use disorder, mild, abuse [F14.10] 06/28/2015  . Tobacco use disorder [F17.200] 06/28/2015  . Cannabis use disorder, severe, dependence ([place]) [F12.20] 06/28/2015  . Alcohol use disorder, moderate, in  sustained remission ([place]) [F10.21] 06/28/2015  . Suicide attempt ([place]) [T14.91XA] 06/27/2015  . Substance-induced psychotic disorder ([place]) [F19.959] 06/26/2015  . Salicylate overdose [J18.841Y] 06/25/2015   Total Time spent with patient: 20 minutes  Past Psychiatric History: As in H&P  Past Medical History:  Past Medical History:  Diagnosis Date  . Anxiety   . Chronic back pain   . Herniated disc   . Hyperlipidemia     Past Surgical History:  Procedure Laterality Date  . NO PAST SURGERIES     Family History:  Family History  Problem Relation Age of Onset  . Bladder Cancer Mother   . Pancreatic cancer Father    Family Psychiatric  History: As in H&P Social History:  History  Alcohol Use  . Yes    Comment: As much as possible, varies with finances     History  Drug Use  . Types: Amphetamines, MDMA (Ecstacy), Marijuana, Cocaine    Comment: intermittent depending on finances    Social History   Social History  . Marital status: Divorced    Spouse name: N/A  . Number of children: N/A  . Years of education: N/A   Social History Main Topics  . Smoking status: Current Every Day Smoker    Packs/day: 3.00    Years: 2.00    Types: Cigarettes  . Smokeless tobacco: Never Used  . Alcohol use Yes     Comment: As much as possible, varies with finances  . Drug use: Yes    Types: Amphetamines, MDMA (Ecstacy), Marijuana, Cocaine     Comment: intermittent depending on finances  .  Sexual activity: Yes   Other Topics Concern  . None   Social History Narrative  . None   Additional Social History:    History of alcohol / drug use?: Yes Longest period of sobriety (when/how long): 12 years Negative Consequences of Use: Financial, Scientist, research (physical sciences), Personal relationships, Work / School Withdrawal Symptoms: Sweats, Tremors         Sleep: Poor  Appetite:  Poor  Current Medications: Current Facility-Administered Medications  Medication Dose Route Frequency Provider Last Rate  Last Dose  . LORazepam (ATIVAN) 1 MG tablet           . acetaminophen (TYLENOL) tablet 650 mg  650 mg Oral Q6H PRN Benjamine Mola, FNP   650 mg at 03/17/17 1439  . alum & mag hydroxide-simeth (MAALOX/MYLANTA) 200-200-20 MG/5ML suspension 30 mL  30 mL Oral Q4H PRN Withrow, Elyse Jarvis, FNP      . feeding supplement (ENSURE ENLIVE) (ENSURE ENLIVE) liquid 237 mL  237 mL Oral Daily PRN Cobos, Myer Peer, MD   237 mL at 03/17/17 1136  . hydrOXYzine (ATARAX/VISTARIL) tablet 25 mg  25 mg Oral TID PRN Benjamine Mola, FNP   25 mg at 03/17/17 1438  . LORazepam (ATIVAN) tablet 1 mg  1 mg Oral Q6H PRN Nikolaus Pienta A, MD      . magnesium hydroxide (MILK OF MAGNESIA) suspension 30 mL  30 mL Oral Daily PRN Withrow, John C, FNP      . meloxicam (MOBIC) tablet 15 mg  15 mg Oral Daily Kerrie Buffalo, NP   15 mg at 03/17/17 0737  . multivitamin with minerals tablet 1 tablet  1 tablet Oral Daily Benjamine Mola, FNP   1 tablet at 03/17/17 [phone]  . nicotine (NICODERM CQ - dosed in mg/24 hours) patch 14 mg  14 mg Transdermal Daily Cobos, Myer Peer, MD   14 mg at 03/17/17 0738  . penicillin v potassium (VEETID) tablet 500 mg  500 mg Oral Q6H Withrow, John C, FNP   500 mg at 03/17/17 1900  . QUEtiapine (SEROQUEL) tablet 100 mg  100 mg Oral QHS,MR X 1 Ethanjames Fontenot A, MD   100 mg at 03/16/17 2116  . QUEtiapine (SEROQUEL) tablet 12.5 mg  12.5 mg Oral Daily PRN Savan Ruta A, MD      . sertraline (ZOLOFT) tablet 25 mg  25 mg Oral Daily Benjamine Mola, FNP   25 mg at 03/17/17 0739  . thiamine (VITAMIN B-1) tablet 100 mg  100 mg Oral Daily Withrow, Elyse Jarvis, FNP   100 mg at 03/17/17 3818    Lab Results:  No results found for this or any previous visit (from the past 48 hour(s)).  Blood Alcohol level:  Lab Results  Component Value Date   ETH <5 29/93/7169    Metabolic Disorder Labs: No results found for: HGBA1C, MPG No results found for: PROLACTIN Lab Results  Component Value Date   CHOL 286 (H)  03/15/2017   TRIG 108 03/15/2017   HDL 45 03/15/2017   CHOLHDL 6.4 03/15/2017   VLDL 22 03/15/2017   LDLCALC 219 (H) 03/15/2017    Physical Findings: AIMS: Facial and Oral Movements Muscles of Facial Expression: None, normal Lips and Perioral Area: None, normal Jaw: None, normal Tongue: None, normal,Extremity Movements Upper (arms, wrists, hands, fingers): None, normal Lower (legs, knees, ankles, toes): None, normal, Trunk Movements Neck, shoulders, hips: None, normal, Overall Severity Severity of abnormal movements (highest score from questions above): None, normal Incapacitation  due to abnormal movements: None, normal Patient's awareness of abnormal movements (rate only patient's report): No Awareness, Dental Status Current problems with teeth and/or dentures?: Yes Does patient usually wear dentures?: Yes  CIWA:  CIWA-Ar Total: 5 COWS:  COWS Total Score: 1  Musculoskeletal: Strength & Muscle Tone: within normal limits Gait & Station: normal Patient leans: N/A  Psychiatric Specialty Exam: Physical Exam  Constitutional: He is oriented to person, place, and time. He appears well-nourished.  HENT:  Head: Normocephalic and atraumatic.  Eyes: Conjunctivae are normal. Pupils are equal, round, and reactive to light.  Neck: Normal range of motion. Neck supple.  Cardiovascular: Normal rate and regular rhythm.   Respiratory: Effort normal and breath sounds normal.  GI: Soft. Bowel sounds are normal.  Musculoskeletal: Normal range of motion.  Neurological: He is alert and oriented to person, place, and time.  Skin: Skin is warm and dry.  Psychiatric:  As above.     ROS  Blood pressure 119/69, pulse 68, temperature 98 F (36.7 C), temperature source Oral, resp. rate 18, height 5' 8"  (1.727 m), weight 76.7 kg (169 lb).Body mass index is 25.7 kg/m.  General Appearance: Well groomed.  Eye Contact:  Good  Speech:  Spontaneous. Normal rate and tone  Volume:  Soft spoken  Mood:   Subjective anxiety  Affect:  Restricted but appropriate  Thought Process:  Linear  Orientation:  Full (Time, Place, and Person)  Thought Content:  Future oriented. No delusional theme. No preoccupation with violent thoughts. Less negative ruminations. No obsession.  No visual hallucinations. Auditory hallucinations are less.   Suicidal Thoughts:  Much less  Homicidal Thoughts:  No  Memory:  WNL  Judgement:  Better  Insight:  Good  Psychomotor Activity:  Normal  Concentration:  WNL   Recall:  Good  Fund of Knowledge:  Good  Language:  Good  Akathisia:  No  Handed:    AIMS (if indicated):     Assets:  Desire for Improvement  ADL's:  Impaired  Cognition:  Impaired,  Mild  Sleep:  Number of Hours: 4.25     Treatment Plan Summary: Patient is still coming off substances. Some degree of psychological cravings for benzodiazepines. We are still finalizing aftercare.    Psychiatric: Substance induced psychotic disorder SUD  Medical:  Psychosocial:  Divorced  Separated from his kids 15 his driving license Unemployed Homelessness No confiding relationship.  PLAN: 1. Seroquel 12.5 mg PRN daily 2. Recommence CIWA protocol 3. Continue to monitor mood, behavior and interaction with peers 4. For discharge as soon as we have placement.   Artist [place], MD 03/17/2017, 7:23 PMPatient ID: Leonard Martinez, male   DOB: Feb 13, 1971, 46 y.o.   MRN: [id number] Patient ID: Leonard Martinez, male   DOB: 1971/05/20, 46 y.o.   MRN: [id number]

## 2017-03-17 NOTE — Progress Notes (Signed)
Patient discussed with MD/nurse his medication requests.  Stated he plans to go to [place], 4 places will probably take him because he has [place] ID.  Needs longer recovery time, [facility].  Apologized to MD/nurse for his behavior today.

## 2017-03-17 NOTE — Progress Notes (Signed)
D   Pt has been calm and pleasant   He discussed loosing his temper earlier in the day with staff and said he felt bad for doing that and he appreciated what we were doing for him    A    Verbal support given   Medications administered and effectiveness monitored    Q 15 min checks   Discussed sobriety and his goals to maintain same R   Pt is safe and receptive to verbal support

## 2017-03-17 NOTE — Plan of Care (Signed)
Problem: Education: Goal: Ability to make informed decisions regarding treatment will improve Outcome: Progressing Nurse discussed suicidal thoughts/depression/anxiety/coping skills with patient.    

## 2017-03-17 NOTE — [facility] Group Notes (Signed)
[facility] LCSW Group Therapy 03/17/2017 1:15pm  Type of Therapy: Group Therapy- Balance in Life  Participation Level: Active   Description of the Group:  The topic for group was balance in life. Today's group focused on defining balance in one's own words, identifying things that can knock one off balance, and exploring healthy ways to maintain balance in life. Group members were asked to provide an example of a time when they felt off balance, describe how they handled that situation,and process healthier ways to regain balance in the future. Group members were asked to share the most important tool for maintaining balance that they [facility] and how they plan to apply this method after discharge.  Summary of Patient Progress Pt states that his life is very unbalanced at the moment. "Every day I wake up and I want to blow my brains out with a pistol. I'm worthless. The only thing keeping me alive is the fact that I'm here in the hospital. If I was out there I would've done it by now". Pt spoke about his commitment to his recovery and how he is looking forward to going to a rehab program to get his life back on track.    Therapeutic Modalities:   Cognitive Behavioral Therapy Solution-Focused Therapy Assertiveness Training   Jonathon JordanLynn B Timesha Cervantez, MSW, LCSWA 03/17/2017 4:10 PM

## 2017-03-17 NOTE — Progress Notes (Addendum)
Patient stated he is upset that MD stopped his ativan, etc.  Needs stronger pain medication.  Will discuss with charge nurse.  MD informed of patient's statements.

## 2017-03-17 NOTE — Progress Notes (Signed)
Adult Psychoeducational Group Note  Date:  03/17/2017 Time:  2030  Group Topic/Focus:  wrap up group  Participation Level:  Active  Participation Quality:  Appropriate, Attentive, Sharing and Supportive  Affect:  Appropriate  Cognitive:  Appropriate  Insight: Improving  Engagement in Group:  Engaged  Modes of Intervention:  Clarification, Education and Support  Additional Comments:    Marcille BuffyMcNeil, Shylin Keizer S 03/17/2017, 10:05 PM

## 2017-03-18 DIAGNOSIS — F1994 Other psychoactive substance use, unspecified with psychoactive substance-induced mood disorder: Secondary | ICD-10-CM

## 2017-03-18 MED ORDER — PENICILLIN V POTASSIUM 250 MG PO TABS
ORAL_TABLET | ORAL | Status: AC
Start: 2017-03-18 — End: 2017-03-19
  Filled 2017-03-18: qty 2

## 2017-03-18 NOTE — [facility] Group Notes (Signed)
[facility] LCSW Group Therapy 03/18/2017 1:15pm  Type of Therapy: Group Therapy- Feelings Around Relapse and Recovery  Participation Level: Pt invited. Did not attend.   Jonathon JordanLynn B Shaquayla Klimas, MSW, Theresia MajorsLCSWA [phone] 03/18/2017 3:35 PM

## 2017-03-18 NOTE — Progress Notes (Signed)
Recreation Therapy Notes  Date: 03/18/17 Time: 0930 Location: [address] Dayroom  Group Topic: Stress Management  Goal Area(s) Addresses:  Patient will verbalize importance of using healthy stress management.  Patient will identify positive emotions associated with healthy stress management.   Behavioral Response: Engaged  Intervention: Stress Management  Activity :  Progressive Muscle Relaxation.  LRT introduced the stress management technique of progressive muscle relaxation.  LRT read a script to allow patients to fully participate in the technique.  Patients were to follow along as the script was read to engage in the activity.  Education:  Stress Management, Discharge Planning.   Education Outcome: Acknowledges edcuation/In group clarification offered/Needs additional education  Clinical Observations/Feedback:  Pt attended group.    Jefry Lesinski, LRT/CTRS         Garik Diamant A 03/18/2017 10:45 AM 

## 2017-03-18 NOTE — Progress Notes (Signed)
Patient ID: Leonard Martinez, male   DOB: 19-Jun-1971, 46 y.o.   MRN: [id number]  Pt currently presents with a flat affect and anxious behavior. Pt reports to writer that their goal is to "try to go down on my nicotine patch, I want to quit when I leave." Pt states "I am hoping to get off this withdrawal." Pt reports good sleep with current medication regimen. Main complaint is headache, reports 7/10 pain.   Pt provided with scheduled and as needed medications per providers orders. Reports no improvement in headache with current pain medicine. Pt's labs and vitals were monitored throughout the night. Pt given a 1:1 about emotional and mental status. Pt supported and encouraged to express concerns and questions. Pt educated on medications.  Pt's safety ensured with 15 minute and environmental checks. Endorses SI, plan to jump off of a bridge at home. States "I won't here, this is a safe place." Pt currently denies HI and A/V hallucinations. Pt verbally agrees to seek staff if SI worsens, HI or A/VH occurs and to consult with staff before acting on any harmful thoughts. Will continue POC.

## 2017-03-18 NOTE — Tx Team (Signed)
Interdisciplinary Treatment and Diagnostic Plan Update 03/18/2017 Time of Session: 9:30am  Leonard Martinez  MRN: [id number]  Principal Diagnosis: Substance-induced psychotic disorder [facility])  Secondary Diagnoses: Principal Problem:   Substance-induced psychotic disorder (HCC) Active Problems:   Cocaine use disorder, mild, abuse   Homelessness   MDD (major depressive disorder), recurrent, severe, with psychosis (HCC)   Current Medications:  Current Facility-Administered Medications  Medication Dose Route Frequency Provider Last Rate Last Dose  . acetaminophen (TYLENOL) tablet 650 mg  650 mg Oral Q6H PRN Beau Fanny, FNP   650 mg at 03/18/17 1118  . alum & mag hydroxide-simeth (MAALOX/MYLANTA) 200-200-20 MG/5ML suspension 30 mL  30 mL Oral Q4H PRN Withrow, Everardo All, FNP      . feeding supplement (ENSURE ENLIVE) (ENSURE ENLIVE) liquid 237 mL  237 mL Oral Daily PRN Cobos, Rockey Situ, MD   237 mL at 03/17/17 1136  . hydrOXYzine (ATARAX/VISTARIL) tablet 25 mg  25 mg Oral TID PRN Beau Fanny, FNP   25 mg at 03/18/17 1118  . LORazepam (ATIVAN) tablet 1 mg  1 mg Oral Q6H PRN Georgiann Cocker, MD   1 mg at 03/18/17 0754  . magnesium hydroxide (MILK OF MAGNESIA) suspension 30 mL  30 mL Oral Daily PRN Withrow, John C, FNP      . meloxicam (MOBIC) tablet 15 mg  15 mg Oral Daily Adonis Brook, NP   15 mg at 03/18/17 0751  . multivitamin with minerals tablet 1 tablet  1 tablet Oral Daily Beau Fanny, FNP   1 tablet at 03/18/17 0751  . nicotine (NICODERM CQ - dosed in mg/24 hours) patch 14 mg  14 mg Transdermal Daily Cobos, Rockey Situ, MD   14 mg at 03/18/17 0751  . penicillin v potassium (VEETID) tablet 500 mg  500 mg Oral Q6H Withrow, John C, FNP   500 mg at 03/18/17 4098  . QUEtiapine (SEROQUEL) tablet 100 mg  100 mg Oral QHS,MR X 1 Izediuno, Vincent A, MD   100 mg at 03/17/17 2245  . QUEtiapine (SEROQUEL) tablet 12.5 mg  12.5 mg Oral Daily PRN Izediuno, Vincent A, MD      . sertraline  (ZOLOFT) tablet 25 mg  25 mg Oral Daily Beau Fanny, FNP   25 mg at 03/18/17 0751  . thiamine (VITAMIN B-1) tablet 100 mg  100 mg Oral Daily Withrow, Everardo All, FNP   100 mg at 03/18/17 1191    PTA Medications: Prescriptions Prior to Admission  Medication Sig Dispense Refill Last Dose  . acetaminophen (TYLENOL) 500 MG tablet Take 1,000 mg by mouth every 6 (six) hours as needed for mild pain.   Past Month at Unknown time  . methocarbamol (ROBAXIN) 500 MG tablet Take 1-2 tablets (500-1,000 mg total) by mouth every 6 (six) hours as needed. 20 tablet 0 Past Week at Unknown time  . [EXPIRED] penicillin v potassium (VEETID) 500 MG tablet Take 1 tablet (500 mg total) by mouth 4 (four) times daily. 28 tablet 0 Past Week at Unknown time    Treatment Modalities: Medication Management, Group therapy, Case management,  1 to 1 session with clinician, Psychoeducation, Recreational therapy.  Patient Stressors: Financial difficulties Health problems Loss of family support Substance abuse Patient Strengths: Geographical information systems officer for treatment/growth Other: Previous history of sobriety  Physician Treatment Plan for Primary Diagnosis: Substance-induced psychotic disorder (HCC) Long Term Goal(s): Improvement in symptoms so as ready for discharge Short Term Goals: Ability to identify changes  in lifestyle to reduce recurrence of condition will improve Ability to verbalize feelings will improve Ability to disclose and discuss suicidal ideas Ability to demonstrate self-control will improve Ability to identify and develop effective coping behaviors will improve Ability to maintain clinical measurements within normal limits will improve Compliance with prescribed medications will improve Ability to identify triggers associated with substance abuse/mental health issues will improve Ability to identify changes in lifestyle to reduce recurrence of condition will improve Ability to verbalize  feelings will improve Ability to disclose and discuss suicidal ideas Ability to demonstrate self-control will improve Ability to identify and develop effective coping behaviors will improve Ability to maintain clinical measurements within normal limits will improve Compliance with prescribed medications will improve Ability to identify triggers associated with substance abuse/mental health issues will improve  Medication Management: Evaluate patient's response, side effects, and tolerance of medication regimen.  Therapeutic Interventions: 1 to 1 sessions, Unit Group sessions and Medication administration.  Evaluation of Outcomes: Progressing  Physician Treatment Plan for Secondary Diagnosis: Principal Problem:   Substance-induced psychotic disorder (HCC) Active Problems:   Cocaine use disorder, mild, abuse   Homelessness   MDD (major depressive disorder), recurrent, severe, with psychosis (HCC)  Long Term Goal(s): Improvement in symptoms so as ready for discharge  Short Term Goals: Ability to identify changes in lifestyle to reduce recurrence of condition will improve Ability to verbalize feelings will improve Ability to disclose and discuss suicidal ideas Ability to demonstrate self-control will improve Ability to identify and develop effective coping behaviors will improve Ability to maintain clinical measurements within normal limits will improve Compliance with prescribed medications will improve Ability to identify triggers associated with substance abuse/mental health issues will improve Ability to identify changes in lifestyle to reduce recurrence of condition will improve Ability to verbalize feelings will improve Ability to disclose and discuss suicidal ideas Ability to demonstrate self-control will improve Ability to identify and develop effective coping behaviors will improve Ability to maintain clinical measurements within normal limits will improve Compliance with  prescribed medications will improve Ability to identify triggers associated with substance abuse/mental health issues will improve  Medication Management: Evaluate patient's response, side effects, and tolerance of medication regimen.  Therapeutic Interventions: 1 to 1 sessions, Unit Group sessions and Medication administration.  Evaluation of Outcomes: Progressing  RN Treatment Plan for Primary Diagnosis: Substance-induced psychotic disorder (HCC) Long Term Goal(s): Knowledge of disease and therapeutic regimen to maintain health will improve  Short Term Goals: Ability to remain free from injury will improve and Compliance with prescribed medications will improve  Medication Management: RN will administer medications as ordered by provider, will assess and evaluate patient's response and provide education to patient for prescribed medication. RN will report any adverse and/or side effects to prescribing provider.  Therapeutic Interventions: 1 on 1 counseling sessions, Psychoeducation, Medication administration, Evaluate responses to treatment, Monitor vital signs and CBGs as ordered, Perform/monitor CIWA, COWS, AIMS and Fall Risk screenings as ordered, Perform wound care treatments as ordered.  Evaluation of Outcomes: Progressing  LCSW Treatment Plan for Primary Diagnosis: Substance-induced psychotic disorder [facility](HCC) Long Term Goal(s): Safe transition to appropriate next level of care at discharge, Engage patient in therapeutic group addressing interpersonal concerns. Short Term Goals: Engage patient in aftercare planning with referrals and resources, Facilitate patient progression through stages of change regarding substance use diagnoses and concerns, Identify triggers associated with mental health/substance abuse issues and Increase skills for wellness and recovery  Therapeutic Interventions: Assess for all discharge needs, 1 to 1  time with Child psychotherapist, Explore available resources and  support systems, Assess for adequacy in community support network, Educate family and significant other(s) on suicide prevention, Complete Psychosocial Assessment, Interpersonal group therapy.  Evaluation of Outcomes: Progressing  Progress in Treatment: Attending groups: Yes Participating in groups: Yes  Taking medication as prescribed: Yes, MD continues to assess for medication changes as needed Toleration medication: Yes, no side effects reported at this time Family/Significant other contact made: No, pt declined contact. Patient understands diagnosis: Developing inisght Discussing patient identified problems/goals with staff: Yes Medical problems stabilized or resolved: Yes Denies suicidal/homicidal ideation: No, pt endorses SI. Issues/concerns per patient self-inventory: None Other: N/A  New problem(s) identified: None identified at this time.   New Short Term/Long Term Goal(s): None identified at this time.   Discharge Plan or Barriers: Pt will discharge to residential treatment. [facility] referral and [facility] referral made 5/7.   Reason for Continuation of Hospitalization:  Anxiety  Depression Medication stabilization Suicidal ideation Withdrawal symptoms  Estimated Length of Stay: 3-5 days  Attendees: Patient: 03/18/2017 11:32 AM  Physician: Dr. Jama Flavors 03/18/2017 11:32 AM  Nursing: Paulita Cradle, RN 03/18/2017 11:32 AM  RN Care Manager: Onnie Boer, RN 03/18/2017 11:32 AM  Social Worker: Donnelly Stager, LCSWA 03/18/2017 11:32 AM  Recreational Therapist:  03/18/2017 11:32 AM  Other: Armandina Stammer, NP; Gray Bernhardt, NP 03/18/2017 11:32 AM  Other:  03/18/2017 11:32 AM  Other: 03/18/2017 11:32 AM   Scribe for Treatment Team: Jonathon Jordan, MSW,LCSWA 03/18/2017 11:32 AM

## 2017-03-18 NOTE — Progress Notes (Signed)
Leonard Martinez is seen OOB UAL on the [address] hall today..he tolerates this fairly well today. A He completes his daily assessment and on this he writes he has had SI today but he contracts with this Clinical research associatewriter to not hurt himself and to seek writer's help if abilitly to contract changes. He  rates his depression, hopelessness and anxiety " 06/15/09", respectively. He has been visible in the dayroom , seen watching the TV and visiting with his peers. R Safety inplace. No s/sx of withdrawal complications observed.

## 2017-03-18 NOTE — Progress Notes (Signed)
[facility]  03/18/2017 11:36 AM Leonard Martinez  MRN:  [id number] Subjective:  "I feel a lot better since yesterday. I slept really well for the first time in awhile. My pain is also a lot more under control. I'm hoping I can leave here soon."  Objective: Pt seen and chart reviewed. Pt is alert/oriented x4, calm, cooperative, and appropriate to situation. Pt denies suicidal/homicidal ideation and psychosis and does not appear to be responding to internal stimuli. Pt reports a major improvement in lucidity, pain, withdrawal symptoms, anxiety, and depression. Pt is also sleeping much better and more interactive with other patients on the unit.    Principal Problem:  Substance Induced Psychotic Disorder Diagnosis:   Patient Active Problem List   Diagnosis Date Noted  . Cocaine use disorder, mild, abuse [F14.10] 06/28/2015    Priority: High  . Substance-induced psychotic disorder [facility](HCC) [F19.959] 06/26/2015    Priority: High  . Homelessness [Z59.0] 03/12/2017    Priority: Medium  . MDD (major depressive disorder), recurrent, severe, with psychosis (HCC) [F33.3] 03/13/2017  . MDD (major depressive disorder), single episode, severe , no psychosis (HCC) [F32.2] 06/28/2015  . Tobacco use disorder [F17.200] 06/28/2015  . Cannabis use disorder, severe, dependence (HCC) [F12.20] 06/28/2015  . Alcohol use disorder, moderate, in sustained remission (HCC) [F10.21] 06/28/2015  . Suicide attempt (HCC) [T14.91XA] 06/27/2015  . Salicylate overdose [T39.091A] 06/25/2015   Total Time spent with patient: 30 minutes  Past Psychiatric History: As in H&P  Past Medical History:  Past Medical History:  Diagnosis Date  . Anxiety   . Chronic back pain   . Herniated disc   . Hyperlipidemia     Past Surgical History:  Procedure Laterality Date  . NO PAST SURGERIES     Family History:  Family History  Problem Relation Age of Onset  . Bladder Cancer Mother   . Pancreatic cancer Father     Family Psychiatric  History: As in H&P Social History:  History  Alcohol Use  . Yes    Comment: As much as possible, varies with finances     History  Drug Use  . Types: Amphetamines, MDMA (Ecstacy), Marijuana, Cocaine    Comment: intermittent depending on finances    Social History   Social History  . Marital status: Divorced    Spouse name: N/A  . Number of children: N/A  . Years of education: N/A   Social History Main Topics  . Smoking status: Current Every Day Smoker    Packs/day: 3.00    Years: 2.00    Types: Cigarettes  . Smokeless tobacco: Never Used  . Alcohol use Yes     Comment: As much as possible, varies with finances  . Drug use: Yes    Types: Amphetamines, MDMA (Ecstacy), Marijuana, Cocaine     Comment: intermittent depending on finances  . Sexual activity: Yes   Other Topics Concern  . None   Social History Narrative  . None   Additional Social History:    History of alcohol / drug use?: Yes Longest period of sobriety (when/how long): 12 years Negative Consequences of Use: Financial, Armed forces operational officerLegal, Personal relationships, Work / School Withdrawal Symptoms: Sweats, Tremors         Sleep: Good  Appetite:  Fair  Current Medications: Current Facility-Administered Medications  Medication Dose Route Frequency Provider Last Rate Last Dose  . acetaminophen (TYLENOL) tablet 650 mg  650 mg Oral Q6H PRN Withrow, John C, FNP   650 mg at  03/18/17 1118  . alum & mag hydroxide-simeth (MAALOX/MYLANTA) 200-200-20 MG/5ML suspension 30 mL  30 mL Oral Q4H PRN Withrow, Everardo All, FNP      . feeding supplement (ENSURE ENLIVE) (ENSURE ENLIVE) liquid 237 mL  237 mL Oral Daily PRN Cobos, Rockey Situ, MD   237 mL at 03/17/17 1136  . hydrOXYzine (ATARAX/VISTARIL) tablet 25 mg  25 mg Oral TID PRN Beau Fanny, FNP   25 mg at 03/18/17 1118  . LORazepam (ATIVAN) tablet 1 mg  1 mg Oral Q6H PRN Georgiann Cocker, MD   1 mg at 03/18/17 0754  . magnesium hydroxide (MILK OF  MAGNESIA) suspension 30 mL  30 mL Oral Daily PRN Withrow, John C, FNP      . meloxicam (MOBIC) tablet 15 mg  15 mg Oral Daily Adonis Brook, NP   15 mg at 03/18/17 0751  . multivitamin with minerals tablet 1 tablet  1 tablet Oral Daily Beau Fanny, FNP   1 tablet at 03/18/17 0751  . nicotine (NICODERM CQ - dosed in mg/24 hours) patch 14 mg  14 mg Transdermal Daily Cobos, Rockey Situ, MD   14 mg at 03/18/17 0751  . penicillin v potassium (VEETID) tablet 500 mg  500 mg Oral Q6H Withrow, John C, FNP   500 mg at 03/18/17 5409  . QUEtiapine (SEROQUEL) tablet 100 mg  100 mg Oral QHS,MR X 1 Izediuno, Vincent A, MD   100 mg at 03/17/17 2245  . QUEtiapine (SEROQUEL) tablet 12.5 mg  12.5 mg Oral Daily PRN Izediuno, Vincent A, MD      . sertraline (ZOLOFT) tablet 25 mg  25 mg Oral Daily Beau Fanny, FNP   25 mg at 03/18/17 0751  . thiamine (VITAMIN B-1) tablet 100 mg  100 mg Oral Daily Withrow, Everardo All, FNP   100 mg at 03/18/17 8119    Lab Results:  No results found for this or any previous visit (from the past 48 hour(s)).  Blood Alcohol level:  Lab Results  Component Value Date   ETH <5 06/25/2015    Metabolic Disorder Labs: No results found for: HGBA1C, MPG No results found for: PROLACTIN Lab Results  Component Value Date   CHOL 286 (H) 03/15/2017   TRIG 108 03/15/2017   HDL 45 03/15/2017   CHOLHDL 6.4 03/15/2017   VLDL 22 03/15/2017   LDLCALC 219 (H) 03/15/2017    Physical Findings: AIMS: Facial and Oral Movements Muscles of Facial Expression: None, normal Lips and Perioral Area: None, normal Jaw: None, normal Tongue: None, normal,Extremity Movements Upper (arms, wrists, hands, fingers): None, normal Lower (legs, knees, ankles, toes): None, normal, Trunk Movements Neck, shoulders, hips: None, normal, Overall Severity Severity of abnormal movements (highest score from questions above): None, normal Incapacitation due to abnormal movements: None, normal Patient's awareness  of abnormal movements (rate only patient's report): No Awareness, Dental Status Current problems with teeth and/or dentures?: Yes Does patient usually wear dentures?: Yes  CIWA:  CIWA-Ar Total: 6 COWS:  COWS Total Score: 1  Musculoskeletal: Strength & Muscle Tone: within normal limits Gait & Station: normal Patient leans: N/A  Psychiatric Specialty Exam: Physical Exam  Constitutional: He is oriented to person, place, and time. He appears well-nourished.  HENT:  Head: Normocephalic and atraumatic.  Eyes: Conjunctivae are normal. Pupils are equal, round, and reactive to light.  Neck: Normal range of motion. Neck supple.  Cardiovascular: Normal rate and regular rhythm.   Respiratory: Effort normal and breath sounds  normal.  GI: Soft. Bowel sounds are normal.  Musculoskeletal: Normal range of motion.  Neurological: He is alert and oriented to person, place, and time.  Skin: Skin is warm and dry.  Psychiatric:  As above.     Review of Systems  Psychiatric/Behavioral: Positive for depression and substance abuse. Negative for hallucinations and suicidal ideas. The patient is nervous/anxious and has insomnia.   All other systems reviewed and are negative.   Blood pressure (!) 144/80, pulse 70, temperature 98 F (36.7 C), temperature source Oral, resp. rate 20, height 5\' 8"  (1.727 m), weight 76.7 kg (169 lb).Body mass index is 25.7 kg/m.  General Appearance: casual, fairly groomed.  Eye Contact:  Good  Speech:  Spontaneous. Clear, coherent, normal rate  Volume:  Soft spoken  Mood:  Mildly anxious  Affect:  Congruent  Thought Process:  Linear, logical, goal-directed  Orientation:  Full (Time, Place, and Person)  Thought Content:  Focused on discharge plans, talking about groups and milieu    Suicidal Thoughts:  Denies  Homicidal Thoughts:  No  Memory:  WNL  Judgement:  Better  Insight:  Good  Psychomotor Activity:  Normal  Concentration:  WNL   Recall:  Good  Fund of  Knowledge:  Good  Language:  Good  Akathisia:  No  Handed:    AIMS (if indicated):     Assets:  Desire for Improvement  ADL's:  Impaired  Cognition:  Impaired,  Mild  Sleep:  Number of Hours: 4.75    Treatment Plan Summary: MDD (major depressive disorder), recurrent, severe, with psychosis (HCC) with substance-induced mood disorder, unstable yet improving, managed as below:  Psychosocial:  Divorced  Separated from his kids Lost his driving license Unemployed Homelessness No confiding relationship.  PLAN: -Continue Seroquel 100mg  po qhs and rpt x1 for insomnia, racing thoughts -Continue Seroquel 12.5mg  po daily prn anxiety -Continue Zoloft 25mg  po daily for depression -Continue Mobic 15mg  po daily for chronic pain -Continue Veetid 500mg  q6h for dental infx for 7 days, ending tomorrow.  -Continue Ativan 1mg  po q6h prn anxiety -Contiue vistaril 25mg  po tid prn anxiety -Continue Ensure daily PRN -Continue Nicotine patch  Beau Fanny, FNP 03/18/2017, 11:36 AM

## 2017-03-18 NOTE — [facility] Group Notes (Signed)
[facility] Group Notes:  (Nursing/MHT/Case Management/Adjunct)  Date:  03/18/2017  Time:  2:58 PM  Type of Therapy:  Nurse Education  Participation Level:  Active  Participation Quality:  Appropriate and Attentive  Affect:  Appropriate  Cognitive:  Appropriate  Insight:  Appropriate  Engagement in Group:  Engaged  Modes of Intervention:  Discussion and Education  Summary of Progress/Problems:  This group discussed relapse prevention, successful goal setting, and healthy coping skills.   Leonard Martinez 03/18/2017, 2:58 PM

## 2017-03-19 NOTE — [facility] Group Notes (Addendum)
[facility] LCSW Group Therapy Note  03/19/2017  and  10:00 AM  Type of Therapy and Topic:  Group Therapy: Avoiding Self-Sabotaging and Enabling Behaviors  Participation Level: Did Not Attend despite overhead announcement and encouragement from facilitator     Carney Bernatherine C Arti Trang, LCSW

## 2017-03-19 NOTE — Progress Notes (Signed)
Patient attended group and said that his day was a 2. His coping skills were attending groups and watching tv.

## 2017-03-19 NOTE — Progress Notes (Signed)
[facility] MD Progress Note  03/19/2017 3:53 PM Leonard Martinez  MRN:  [id number] Subjective:  "I feel better and more stable on my Ativan. Less headaches than yesterday. Some thoughts of wanting to die but no plan."  Objective: Pt seen and chart reviewed. Pt is alert/oriented x4, calm, cooperative, and appropriate to situation. Pt denies suicidal/homicidal ideation and psychosis and does not appear to be responding to internal stimuli. Pt continues to report improvement overall in terms of a reduction in anxiety/depression but a worsening in agitation due to the [facility] not working very well in his room.   Principal Problem:  Substance Induced Psychotic Disorder Diagnosis:   Patient Active Problem List   Diagnosis Date Noted  . MDD (major depressive disorder), recurrent, severe, with psychosis (HCC) [F33.3] 03/13/2017    Priority: High  . Cocaine use disorder, mild, abuse [F14.10] 06/28/2015    Priority: High  . Substance-induced psychotic disorder [facility]) [F19.959] 06/26/2015    Priority: High  . Homelessness [Z59.0] 03/12/2017    Priority: Medium  . Substance induced mood disorder (HCC) [F19.94]   . MDD (major depressive disorder), single episode, severe , no psychosis (HCC) [F32.2] 06/28/2015  . Tobacco use disorder [F17.200] 06/28/2015  . Cannabis use disorder, severe, dependence (HCC) [F12.20] 06/28/2015  . Alcohol use disorder, moderate, in sustained remission (HCC) [F10.21] 06/28/2015  . Suicide attempt (HCC) [T14.91XA] 06/27/2015  . Salicylate overdose [T39.091A] 06/25/2015   Total Time spent with patient: 30 minutes  Past Psychiatric History: As in H&P  Past Medical History:  Past Medical History:  Diagnosis Date  . Anxiety   . Chronic back pain   . Herniated disc   . Hyperlipidemia     Past Surgical History:  Procedure Laterality Date  . NO PAST SURGERIES     Family History:  Family History  Problem Relation Age of Onset  . Bladder Cancer Mother   . Pancreatic cancer Father     Family Psychiatric  History: As in H&P Social History:  History  Alcohol Use  . Yes    Comment: As much as possible, varies with finances     History  Drug Use  . Types: Amphetamines, MDMA (Ecstacy), Marijuana, Cocaine    Comment: intermittent depending on finances    Social History   Social History  . Marital status: Divorced    Spouse name: N/A  . Number of children: N/A  . Years of education: N/A   Social History Main Topics  . Smoking status: Current Every Day Smoker    Packs/day: 3.00    Years: 2.00    Types: Cigarettes  . Smokeless tobacco: Never Used  . Alcohol use Yes     Comment: As much as possible, varies with finances  . Drug use: Yes    Types: Amphetamines, MDMA (Ecstacy), Marijuana, Cocaine     Comment: intermittent depending on finances  . Sexual activity: Yes   Other Topics Concern  . None   Social History Narrative  . 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 acetaminophen (TYLENOL) tablet 650 mg  650 mg Oral Q6H PRN Blaike Newburn, Everardo All, FNP  650 mg at 03/19/17 1205  . alum & mag hydroxide-simeth (MAALOX/MYLANTA) 200-200-20 MG/5ML suspension 30 mL  30 mL Oral Q4H PRN Orey Moure, Everardo AllJohn C, FNP      . feeding supplement (ENSURE ENLIVE) (ENSURE ENLIVE) liquid 237 mL  237 mL Oral Daily PRN Cobos, Rockey SituFernando A, MD   237 mL at 03/18/17 2110  . hydrOXYzine (ATARAX/VISTARIL) tablet 25 mg  25 mg Oral TID PRN Beau FannyWithrow, Hatcher Froning C, FNP   25 mg at 03/19/17 1205  . LORazepam (ATIVAN) tablet 1 mg  1 mg Oral Q6H PRN Georgiann CockerIzediuno, Vincent A, MD   1 mg at 03/19/17 0941  . magnesium hydroxide (MILK OF  MAGNESIA) suspension 30 mL  30 mL Oral Daily PRN Nikko Goldwire C, FNP      . meloxicam (MOBIC) tablet 15 mg  15 mg Oral Daily Adonis BrookAgustin, Sheila, NP   15 mg at 03/19/17 0857  . multivitamin with minerals tablet 1 tablet  1 tablet Oral Daily Mollie Rossano, Everardo AllJohn C, FNP   1 tablet at 03/19/17 0857  . nicotine (NICODERM CQ - dosed in mg/24 hours) patch 14 mg  14 mg Transdermal Daily Cobos, Rockey SituFernando A, MD   14 mg at 03/19/17 0857  . QUEtiapine (SEROQUEL) tablet 100 mg  100 mg Oral QHS,MR X 1 Izediuno, Vincent A, MD   100 mg at 03/18/17 2206  . QUEtiapine (SEROQUEL) tablet 12.5 mg  12.5 mg Oral Daily PRN Izediuno, Delight OvensVincent A, MD   12.5 mg at 03/19/17 1204  . sertraline (ZOLOFT) tablet 25 mg  25 mg Oral Daily Beau FannyWithrow, Landree Fernholz C, FNP   25 mg at 03/19/17 0857  . thiamine (VITAMIN B-1) tablet 100 mg  100 mg Oral Daily Toan Mort, Everardo AllJohn C, FNP   100 mg at 03/19/17 [id number]    Lab Results:  No results found for this or any previous visit (from the past 48 hour(s)).  Blood Alcohol level:  Lab Results  Component Value Date   ETH <5 06/25/2015    Metabolic Disorder Labs: No results found for: HGBA1C, MPG No results found for: PROLACTIN Lab Results  Component Value Date   CHOL 286 (H) 03/15/2017   TRIG 108 03/15/2017   HDL 45 03/15/2017   CHOLHDL 6.4 03/15/2017   VLDL 22 03/15/2017   LDLCALC 219 (H) 03/15/2017    Physical Findings: AIMS: Facial and Oral Movements Muscles of Facial Expression: None, normal Lips and Perioral Area: None, normal Jaw: None, normal Tongue: None, normal,Extremity Movements Upper (arms, wrists, hands, fingers): None, normal Lower (legs, knees, ankles, toes): None, normal, Trunk Movements Neck, shoulders, hips: None, normal, Overall Severity Severity of abnormal movements (highest score from questions above): None, normal Incapacitation due to abnormal movements: None, normal Patient's awareness of abnormal movements (rate only patient's report): No Awareness, Dental Status Current  problems with teeth and/or dentures?: Yes Does patient usually wear dentures?: Yes  CIWA:  CIWA-Ar Total: 4 COWS:  COWS Total Score: 1  Musculoskeletal: Strength & Muscle Tone: within normal limits Gait & Station: normal Patient leans: N/A  Psychiatric Specialty Exam: Physical Exam  Psychiatric:  As above.     Review of Systems  Psychiatric/Behavioral: Positive for depression and substance abuse. Negative for hallucinations and suicidal ideas. The patient is nervous/anxious and has insomnia.   All other systems reviewed and are negative.   Blood pressure 129/73, pulse 72, temperature 97.5 F (36.4 C), temperature source Oral, resp. rate 18, height 5\' 8"  (1.727 m), weight 76.7 kg (169 lb).Body mass index is 25.7 kg/m.  General Appearance:  fairly groomed.  Eye Contact:  Fair  Speech:  Clear, coherent, normal rate  Volume:  Normal  Mood:  Euthymic  Affect:  Congruent  Thought Process:  Linear, logical, goal-directed  Orientation:  Full (Time, Place, and Person)  Thought Content:  Focused on discharge plans, talking about groups and some about headaches and the [facility]  Suicidal Thoughts:  Yes, although denies plan/intent  Homicidal Thoughts:  No  Memory:  WNL  Judgement:  Better  Insight:  Good  Psychomotor Activity:  Normal  Concentration:  WNL   Recall:  Good  Fund of Knowledge:  Good  Language:  Good  Akathisia:  No  Handed:    AIMS (if indicated):     Assets:  Desire for Improvement  ADL's:  Impaired  Cognition:  Impaired,  Mild  Sleep:  Number of Hours: 7    Treatment Plan Summary: MDD (major depressive disorder), recurrent, severe, with psychosis (HCC) with substance-induced mood disorder, unstable yet improving, managed as below:  On 03/19/2017 , I have reviewed medications below and concur with regimen with the following changes:   Psychosocial:  Divorced  Separated from his kids Lost his driving license Unemployed Homelessness No confiding  relationship.  PLAN: -Continue Seroquel 100mg  po qhs and rpt x1 for insomnia, racing thoughts -Continue Seroquel 12.5mg  po daily prn anxiety -Continue Zoloft 25mg  po daily for depression -Continue Mobic 15mg  po daily for chronic pain -Continue Veetid 500mg  q6h for dental infx for 7 days, ending tomorrow.  -Continue Ativan 1mg  po q6h prn anxiety -Contiue vistaril 25mg  po tid prn anxiety -Continue Ensure daily PRN -Continue Nicotine patch  Beau Fanny, FNP 03/19/2017, 3:53 PM

## 2017-03-19 NOTE — Progress Notes (Addendum)
[name] cont to get a little stronger ea day. He is seen out in the milieu. He spends much of his time resting in his bed..he says this is how he toelrates his high anxiety levels. A HE completed his daily assessment and on this he wrote he has experienced SI today  But he conracts with writer to not hurt himslef. He rated his depression, hopelessness and anxiety " 9/9/9/", repsectively. He requests prn frequently for " a lot" of anxiety. R Safety in ploace

## 2017-03-20 MED ORDER — GABAPENTIN 300 MG PO CAPS
300.0000 mg | ORAL_CAPSULE | Freq: Three times a day (TID) | ORAL | Status: DC
  Administered 2017-03-20 – 2017-03-22 (×5): 300 mg via ORAL
  Filled 2017-03-20 (×13): qty 1

## 2017-03-20 MED ORDER — QUETIAPINE FUMARATE 50 MG PO TABS
150.0000 mg | ORAL_TABLET | Freq: Every evening | ORAL | Status: DC | PRN
  Administered 2017-03-20 (×2): 150 mg via ORAL
  Filled 2017-03-20 (×4): qty 3

## 2017-03-20 MED ORDER — QUETIAPINE FUMARATE 25 MG PO TABS
25.0000 mg | ORAL_TABLET | Freq: Two times a day (BID) | ORAL | Status: DC | PRN
  Administered 2017-03-20: 25 mg via ORAL
  Filled 2017-03-20 (×2): qty 1

## 2017-03-20 NOTE — Progress Notes (Signed)
Data. Patient denies HI/AVH Does continue to endorse passive SI without a concrete plan. He is able to verbally contract for safety on the unit and to come to staff before acting on any self, or other harm, thoughts. Patient was very agitated and angry this morning when staff would not give him a PRN that was not due. Patient became very angry and began raising his voice. Patient also refused to take his morning medications, until he could get his PRN. Patient moved into the dayroom and continued to refuse to take his medications, "Until you bring me that fucking Seroquel." Patient was able to calm down with staff assist and did take his medications shotrtly there after. For the rest of th day patient was calm and appropriate. Action. Emotional support and encouragement offered. Education provided on medication, indications and side effect. Q 15 minute checks done for safety. Response. Safety on the unit maintained through 15 minute checks.  Medications taken as prescribed. Attended groups. Remained calm and appropriate through out shift.

## 2017-03-20 NOTE — Progress Notes (Signed)
Writer observed patient up in the dayroom watching tv with little to no interaction with peers. Writer spoke with him 1:1 and he reports that  his day has not been the best and he c/o air conditoner not working properly in his room and feeling anxious most of the day. Writer checked out his ac and everything was working properly and his room was cool. Writer informed him that the room was cool and everything seemed to be working properly. He was in formed of his medications scheduled and he inquired about when he could get medication for his anxiety. Medication given for anxiety, will monitor effectiveness. Safety maintained on uit with 15 min checks.

## 2017-03-20 NOTE — [facility] Group Notes (Signed)
[facility] LCSW Group Therapy  03/20/2017  10-11 AM  Type of Therapy:  Group Therapy  Participation Level:  Minimal  Participation Quality:  Resistant  Affect:  Blunted  Cognitive:  Alert  Insight:  None shared  Engagement in Therapy:  None noted  Modes of Intervention:  Discussion, Exploration, Rapport Building, Socialization and Support  Summary of Progress/Problems: Topic for today was thoughts and feelings regarding discharge. We discussed fears of upcoming changes including judgements, expectations and stigma of mental health issues. We then discussed supports: what constitutes a supportive framework, identification of supports and what to do when others are not supportive. Patient was resistant to participation and stated he was only present ''because that is what Im supposed to do"  [name], LCSW

## 2017-03-20 NOTE — Progress Notes (Signed)
[facility] MD Progress Note  03/20/2017 1:45 PM Leonard Martinez  MRN:  [id number]  Subjective:  "I have been agitated all morning long. I woke up in a bad mood. I tried to isolate myself from other people because I don't want to blow-up on any body. I'm still hearing voices. Suicidal thought, yes, it has not gone away".  Objective: Pt seen and chart reviewed. Pt is alert/oriented x 4. He presents agitated, but cooperative. His affect is flat. He says he is feeling agitated & in a very bad mood. Pt admits suicidal ideations, denies homicidal ideation. Continues to endorse psychosis, however, does not appear to be responding to any internal stimuli. Denies any reduction in anxiety/depression. He is able to contract for safety. See medication adjustment on the treatment plan.  Principal Problem:  Substance Induced Psychotic Disorder Diagnosis:   Patient Active Problem List   Diagnosis Date Noted  . Substance induced mood disorder (HCC) [F19.94]   . MDD (major depressive disorder), recurrent, severe, with psychosis (HCC) [F33.3] 03/13/2017  . Homelessness [Z59.0] 03/12/2017  . MDD (major depressive disorder), single episode, severe , no psychosis (HCC) [F32.2] 06/28/2015  . Cocaine use disorder, mild, abuse [F14.10] 06/28/2015  . Tobacco use disorder [F17.200] 06/28/2015  . Cannabis use disorder, severe, dependence (HCC) [F12.20] 06/28/2015  . Alcohol use disorder, moderate, in sustained remission (HCC) [F10.21] 06/28/2015  . Suicide attempt (HCC) [T14.91XA] 06/27/2015  . 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 Salicylate overdose [T39.091A] 06/25/2015   Total Time spent with patient: 30 minutes  Past Psychiatric History: As in H&P  Past Medical History:  Past Medical History:  Diagnosis Date  . Anxiety   . Chronic back pain   . Herniated disc   . Hyperlipidemia     Past Surgical History:  Procedure Laterality Date  . NO PAST SURGERIES     Family History:   Family History  Problem Relation Age of Onset  . Bladder Cancer Mother   . Pancreatic cancer Father    Family Psychiatric  History: As in H&P Social History:  History  Alcohol Use  . Yes    Comment: As much as possible, varies with finances     History  Drug Use  . Types: Amphetamines, MDMA (Ecstacy), Marijuana, Cocaine    Comment: intermittent depending on finances    Social History   Social History  . Marital status: Divorced    Spouse name: N/A  . Number of children: N/A  . Years of education: N/A   Social History Main Topics  . Smoking status: Current Every Day Smoker    Packs/day: 3.00    Years: 2.00    Types: Cigarettes  . Smokeless tobacco: Never Used  . Alcohol use Yes     Comment: As much as possible, varies with finances  . Drug use: Yes    Types: Amphetamines, MDMA (Ecstacy), Marijuana, Cocaine     Comment: intermittent depending on finances  . Sexual activity: Yes   Other Topics Concern  . None   Social History Narrative  . 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 acetaminophen (TYLENOL) tablet 650  mg  650 mg Oral Q6H PRN Beau Fanny, FNP   650 mg at 03/20/17 0025  . alum & mag hydroxide-simeth (MAALOX/MYLANTA) 200-200-20 MG/5ML suspension 30 mL  30 mL Oral Q4H PRN Withrow, Everardo All, FNP      . feeding supplement (ENSURE ENLIVE) (ENSURE ENLIVE) liquid 237 mL  237 mL Oral Daily PRN Cobos, Rockey Situ, MD   237 mL at 03/18/17 2110  . gabapentin (NEURONTIN) capsule 300 mg  300 mg Oral TID Armandina Stammer I, NP   300 mg at 03/20/17 1129  . hydrOXYzine (ATARAX/VISTARIL) tablet  25 mg  25 mg Oral TID PRN Beau Fanny, FNP   25 mg at 03/20/17 1129  . LORazepam (ATIVAN) tablet 1 mg  1 mg Oral Q6H PRN Georgiann Cocker, MD   1 mg at 03/20/17 0913  . magnesium hydroxide (MILK OF MAGNESIA) suspension 30 mL  30 mL Oral Daily PRN Withrow, John C, FNP      . meloxicam (MOBIC) tablet 15 mg  15 mg Oral Daily Adonis Brook, NP   15 mg at 03/20/17 0909  . multivitamin with minerals tablet 1 tablet  1 tablet Oral Daily Beau Fanny, FNP   1 tablet at 03/20/17 0910  . nicotine (NICODERM CQ - dosed in mg/24 hours) patch 14 mg  14 mg Transdermal Daily Cobos, Rockey Situ, MD   14 mg at 03/20/17 0910  . QUEtiapine (SEROQUEL) tablet 100 mg  100 mg Oral QHS,MR X 1 Izediuno, Vincent A, MD   100 mg at 03/19/17 2207  . QUEtiapine (SEROQUEL) tablet 25 mg  25 mg Oral BID BM PRN Armandina Stammer I, NP      . sertraline (ZOLOFT) tablet 25 mg  25 mg Oral Daily Beau Fanny, FNP   25 mg at 03/20/17 0909  . thiamine (VITAMIN B-1) tablet 100 mg  100 mg Oral Daily Withrow, Everardo All, FNP   100 mg at 03/20/17 1610    Lab Results:  No results found for this or any previous visit (from the past 48 hour(s)).  Blood Alcohol level:  Lab Results  Component Value Date   ETH <5 06/25/2015    Metabolic Disorder Labs: No results found for: HGBA1C, MPG No results found for: PROLACTIN Lab Results  Component Value Date   CHOL 286 (H) 03/15/2017   TRIG 108 03/15/2017   HDL 45 03/15/2017   CHOLHDL 6.4 03/15/2017   VLDL 22 03/15/2017   LDLCALC 219 (H) 03/15/2017    Physical Findings: AIMS: Facial and Oral Movements Muscles of Facial Expression: None, normal Lips and Perioral Area: None, normal Jaw: None, normal Tongue: None, normal,Extremity Movements Upper (arms, wrists, hands, fingers): None, normal Lower (legs, knees, ankles, toes): None, normal, Trunk Movements Neck, shoulders, hips: None, normal, Overall Severity Severity of abnormal movements (highest score from questions above): None,  normal Incapacitation due to abnormal movements: None, normal Patient's awareness of abnormal movements (rate only patient's report): No Awareness, Dental Status Current problems with teeth and/or dentures?: Yes Does patient usually wear dentures?: No  CIWA:  CIWA-Ar Total: 3 COWS:  COWS Total Score: 1  Musculoskeletal: Strength & Muscle Tone: within normal limits Gait & Station: normal Patient leans: N/A  Psychiatric Specialty Exam: Physical Exam  Psychiatric:  As above.     Review of Systems  Psychiatric/Behavioral: Positive for depression and substance abuse. Negative for hallucinations and suicidal ideas. The patient is nervous/anxious and has insomnia.   All other systems reviewed and are  negative.   Blood pressure 133/88, pulse 78, temperature 98 F (36.7 C), temperature source Oral, resp. rate 18, height 5\' 8"  (1.727 m), weight 76.7 kg (169 lb).Body mass index is 25.7 kg/m.  General Appearance:  fairly groomed.  Eye Contact:  Fair  Speech:  Clear, coherent, normal rate  Volume:  Normal  Mood:  Euthymic  Affect:  Congruent  Thought Process:  Linear, logical, goal-directed  Orientation:  Full (Time, Place, and Person)  Thought Content:  Focused on discharge plans, talking about groups and some [facility] headaches [facility]  Suicidal Thoughts:  Yes, although denies plan/intent  Homicidal Thoughts:  No  Memory:  WNL  Judgement:  Better  Insight:  Good  Psychomotor Activity:  Normal  Concentration:  WNL   Recall:  Good  Fund of Knowledge:  Good  Language:  Good  Akathisia:  No  Handed:    AIMS (if indicated):     Assets:  Desire for Improvement  ADL's:  Impaired  Cognition:  Impaired,  Mild  Sleep:  Number of Hours: 5.5    Treatment Plan Summary: MDD (major depressive disorder), recurrent, severe, with psychosis (HCC) with substance-induced mood disorder, unstable yet improving, managed as below:  On 03/20/2017 , I have reviewed medications below and  concur with regimen with the following changes:   Psychosocial:  Divorced  Separated from his kids Lost his driving license Unemployed Homelessness No confiding relationship.  PLAN: -Increased Seroquel to 150 mg po qhs for insomnia, racing thoughts. -increased Seroquel from 12.5 mg to 25 mg po daily prn agitation/anxiety -Continue Zoloft 25mg  po daily for depression -Continue Mobic 15mg  po daily for chronic pain -Continue Veetid 500mg  q6h for dental infection for 7 days, will complete today.  -Continue Ativan 1mg  po q6h prn anxiety -Contiue vistaril 25mg  po tid prn anxiety -Continue Ensure daily PRN -Continue Nicotine patch. Will initiate Neurontin 300 mg tid for agitation/alcohol withdrawal syndrome.  Sanjuana KavaNwoko, Agnes I, NP, PMHNP, FNP-BC. 03/20/2017, 1:45 PM  Patient ID: Leonard Martinez, male   DOB: [birth year], 46 y.o.   MRN: [id number]

## 2017-03-20 NOTE — [facility] Group Notes (Signed)
[facility] Group Notes:  (Nursing/MHT/Case Management/Adjunct)  Date:  03/20/2017  Time:  11:56 PM  Type of Therapy:  Psychoeducational Skills  Participation Level:  Minimal  Participation Quality:  Appropriate  Affect:  Anxious  Cognitive:  Appropriate  Insight:  Good  Engagement in Group:  Engaged  Modes of Intervention:  Discussion  Summary of Progress/Problems: Pt stated his day was bad earlier, but improved through the evening  Delos Haringhillips, Kiaraliz Rafuse A 03/20/2017, 11:56 PM

## 2017-03-21 MED ORDER — NICOTINE 7 MG/24HR TD PT24
7.0000 mg | MEDICATED_PATCH | Freq: Every day | TRANSDERMAL | Status: DC
  Administered 2017-03-21 – 2017-03-24 (×3): 7 mg via TRANSDERMAL
  Filled 2017-03-21 (×4): qty 1

## 2017-03-21 MED ORDER — SERTRALINE HCL 50 MG PO TABS
50.0000 mg | ORAL_TABLET | Freq: Every day | ORAL | Status: DC
Start: 2017-03-22 — End: 2017-03-24
  Administered 2017-03-22 – 2017-03-24 (×3): 50 mg via ORAL
  Filled 2017-03-21 (×5): qty 1
  Filled 2017-03-21: qty 21

## 2017-03-21 MED ORDER — QUETIAPINE FUMARATE 50 MG PO TABS
150.0000 mg | ORAL_TABLET | Freq: Every day | ORAL | Status: DC
  Administered 2017-03-21: 150 mg via ORAL
  Filled 2017-03-21 (×3): qty 3

## 2017-03-21 MED ORDER — QUETIAPINE FUMARATE 25 MG PO TABS
25.0000 mg | ORAL_TABLET | Freq: Two times a day (BID) | ORAL | Status: DC
  Administered 2017-03-21 – 2017-03-24 (×6): 25 mg via ORAL
  Filled 2017-03-21: qty 1
  Filled 2017-03-21 (×2): qty 42
  Filled 2017-03-21: qty 1
  Filled 2017-03-21: qty 42
  Filled 2017-03-21 (×8): qty 1

## 2017-03-21 NOTE — Progress Notes (Signed)
Recreation Therapy Notes  Date: 03/21/17 Time: 0930 Location: [address] Dayroom  Group Topic: Stress Management  Goal Area(s) Addresses:  Patient will verbalize importance of using healthy stress management.  Patient will identify positive emotions associated with healthy stress management.   Intervention: Stress Management  Activity :  Guided Imagery.  LRT introduced the stress management technique of guided imagery.  LRT read a script to allow patients to engage in the technique.  Patients were to follow along as LRT read the script to fully participate in the technique.  Education:  Stress Management, Discharge Planning.   Education Outcome: Acknowledges edcuation/In group clarification offered/Needs additional education  Clinical Observations/Feedback: Pt did not attend group.    Burrel Legrand, LRT/CTRS         Joaovictor Krone A 03/21/2017 12:36 PM 

## 2017-03-21 NOTE — Plan of Care (Signed)
Problem: Self-Concept: Goal: Ability to disclose and discuss suicidal ideas will improve Outcome: Progressing Pt endorses SI without a plan.  He verbally contracts for safety.

## 2017-03-21 NOTE — Progress Notes (Signed)
D: Pt was in the dayroom upon initial approach.  Pt presents with anxious, depressed affect and mood.  His goal was "to make groups, but we only had one."  Pt reports SI without a plan.  He verbally contracts for safety.  Denies HI, denies visual hallucinations, reports knee pain of 6/10.  Pt reports auditory hallucinations of "voices telling me I'm worthless in general."  Pt has been visible in milieu interacting with peers and staff appropriately.  Pt attended evening group.    A: Introduced self to pt.  Actively listened to pt and offered support and encouragement. Medications administered per order.  PRN medication administered for pain and anxiety.  Q15 minute safety checks maintained.  R: Pt is safe on the unit.  Pt is compliant with medications.  Pt verbally contracts for safety.  Will continue to monitor and assess.

## 2017-03-21 NOTE — Progress Notes (Signed)
D: Pt irritable on approach. Pt presented with flat affect. Pt became agitated with writer and refused to take all meds. Pt requested to take Ativan and Seroquel PRN together for anxiety. Writer offered to administer Ativan or Seroquel for anxiety and explained to pt that after follow up in a hour, if he's still anxious writer will then administer a second med for anxiety. Pt got upset and refused to take all meds. Pt stated, "You all need to get my medications together and know what's going on". Writer checked MAR to see if pt had received Ativan and Seroquel together for anxiety. There was no documentation indicating that the two meds had been given together. Writer informed pt and he continued to refused meds. Writer offered meds to pt 3 times. Pt refused all 3 times. Pt remains irritable.  Writer discussed with MD pt complaint. Meds adjusted per MD.  A: Medications reviewed with pt. Medications administered as ordered per MD. 1:1 support provided. Pt encouraged to attend groups as tolerable. 15 minute checks performed for safety.  R: Pt noncompliant with tx.

## 2017-03-21 NOTE — [facility] Group Notes (Signed)
[facility] LCSW Group Therapy  03/21/2017 1:15pm  Type of Therapy: Group Therapy   Topic: Overcoming Obstacles  Participation Level: Pt invited. Did not attend. Pt stated "They keep messing around with my medications. Until they get it right I'm not going to groups, I'm not eating, I'm not doing anything. I'm tired of going through this mess with my meds every morning".  Jonathon JordanLynn B Inella Kuwahara, MSW, Theresia MajorsLCSWA [phone]

## 2017-03-21 NOTE — Progress Notes (Signed)
Patient ID: Leonard Martinez, male   DOB: [birth year], 46 y.o.   MRN: [id number] D: Client irritable expressing concerns about medication changes, "I been fighting these changes with anxiety for the last four days" "I don't understand why my medication keep changing" Client visible in the dayroom and seen on the phone. A: Writer provided emotional support, reviewed medications administered as ordered. Staff will monitor q4715min for safety. R:Client is safe on the unit.

## 2017-03-21 NOTE — Progress Notes (Signed)
[facility] Group Notes:  (Nursing/MHT/Case Management/Adjunct)  Date:  03/21/2017  Time:  11:54 PM  Type of Therapy:  Psychoeducational Skills  Participation Level:  Minimal  Participation Quality:  Inattentive  Affect:  Angry and Irritable  Cognitive:  Lacking  Insight:  Lacking  Engagement in Group:  Resistant  Modes of Intervention:  Education  Summary of Progress/Problems: The patient refused to share anything with regards to his day. He states that no one cares about him in the hospital and that he isn't accomplishing by being an inpatient. He would only share that his medications have been changed around on multiple occasions and that he feels no better off than he did on admission to the hospital.   Hazle CocaGOODMAN, Jaser Fullen S 03/21/2017, 11:54 PM

## 2017-03-21 NOTE — Progress Notes (Signed)
[facility] MD Progress Note  03/21/2017 11:34 AM Leonard Martinez  MRN:  [id number]  Subjective:   Patient reports feeling irritable, angry, and expresses anger regarding perception that" nurses and staff don't seem to know what they are doing" . Reports ongoing intermittent  suicidal ideations, with thoughts of " shooting myself ", but denies any current plan or intention and contracts for safety on unit. He is , however, future oriented, and states he is hoping to be able to go to a rehab setting in [place] upon discharge, and explains that he is a resident of [place], so it would be easier for him to receive services there .   Objective: I have met with patient and have discussed case with treatment team. 46 year old male,admitted 5/5 due to depression, SI, auditory hallucinations, and substance abuse ( reported abusing cannabis, ETOH, cocaine , MDMA)  . Admission UDS was positive for Cocaine and Cannabis . Patient had been hospitalized at [facility] in August of 2016, at which time he was diagnosed with depression, discharged on Celexa, Rozerem,  Vistaril, referred to outpatient treatment in [place].  Patient was not taking any psychiatric medications prior to this admission . Patient reports significant psychosocial stressors, losses, to include divorce 2 years ago, homelessness. Today presents irritable, dysphoric, angry, states he continues to feel depressed . He endorses lingering SI, but denies plan or intention of hurting self at this time, and contracts for safety on unit. He is currently focused on medication issues, and states he feels " staff has not gotten my meds right yet". Focuses on Ativan PRNs, which he states help decrease anxiety, and is concerned he is not getting it at times when he feels the most anxious. No current symptoms of WDL - no tremors, no diaphoresis, no psychomotor restlessness. Vitals stable . No overtly disruptive behaviors on unit , going to some  groups.     Principal Problem:  Substance Induced Psychotic Disorder Diagnosis:   Patient Active Problem List   Diagnosis Date Noted  . Substance induced mood disorder ([place]) [F19.94]   . MDD (major depressive disorder), recurrent, severe, with psychosis ([place]) [F33.3] 03/13/2017  . Homelessness [Z59.0] 03/12/2017  . MDD (major depressive disorder), single episode, severe , no psychosis ([place]) [F32.2] 06/28/2015  . Cocaine use disorder, mild, abuse [F14.10] 06/28/2015  . Tobacco use disorder [F17.200] 06/28/2015  . Cannabis use disorder, severe, dependence ([employer]) [F12.20] 06/28/2015  . Alcohol use disorder, moderate, in sustained remission ([place]) [F10.21] 06/28/2015  . Suicide attempt ([place]) [T14.91XA] 06/27/2015  . Substance-induced psychotic disorder ([place]) [F19.959] 06/26/2015  . Salicylate overdose [B01.751W] 06/25/2015   Total Time spent with patient: 25 minutes   Past Psychiatric History: As in H&P  Past Medical History:  Past Medical History:  Diagnosis Date  . Anxiety   . Chronic back pain   . Herniated disc   . Hyperlipidemia     Past Surgical History:  Procedure Laterality Date  . NO PAST SURGERIES     Family History:  Family History  Problem Relation Age of Onset  . Bladder Cancer Mother   . Pancreatic cancer Father    Family Psychiatric  History: As in H&P Social History:  History  Alcohol Use  . Yes    Comment: As much as possible, varies with finances     History  Drug Use  . Types: Amphetamines, MDMA (Ecstacy), Marijuana, Cocaine    Comment: intermittent depending on finances    Social History   Social History  .  Marital status: Divorced    Spouse name: N/A  . Number of children: N/A  . Years of education: N/A   Social History Main Topics  . Smoking status: Current Every Day Smoker    Packs/day: 3.00    Years: 2.00    Types: Cigarettes  . Smokeless tobacco: Never Used  . Alcohol use Yes     Comment: As much as possible, varies with  finances  . Drug use: Yes    Types: Amphetamines, MDMA (Ecstacy), Marijuana, Cocaine     Comment: intermittent depending on finances  . Sexual activity: Yes   Other Topics Concern  . None   Social History Narrative  . None   Additional Social History:    History of alcohol / drug use?: Yes Longest period of sobriety (when/how long): 12 years Negative Consequences of Use: Financial, [employer] forces operational [employer], Personal relationships, Work / School Withdrawal Symptoms: Sweats, Tremors         Sleep: Fair  Appetite:  Fair  Current Medications: Current Facility-Administered Medications  Medication Dose Route Frequency Provider Last Rate Last Dose  . acetaminophen (TYLENOL) tablet 650 mg  650 mg Oral Q6H PRN Beau Fanny, FNP   650 mg at 03/21/17 0758  . alum & mag hydroxide-simeth (MAALOX/MYLANTA) 200-200-20 MG/5ML suspension 30 mL  30 mL Oral Q4H PRN Withrow, Everardo All, FNP      . feeding supplement (ENSURE ENLIVE) (ENSURE ENLIVE) liquid 237 mL  237 mL Oral Daily PRN Cobos, Rockey Situ, MD   237 mL at 03/18/17 2110  . gabapentin (NEURONTIN) capsule 300 mg  300 mg Oral TID Armandina Stammer I, NP   300 mg at 03/21/17 0755  . hydrOXYzine (ATARAX/VISTARIL) tablet 25 mg  25 mg Oral TID PRN Beau Fanny, FNP   25 mg at 03/21/17 0122  . LORazepam (ATIVAN) tablet 1 mg  1 mg Oral Q6H PRN Georgiann Cocker, MD   1 mg at 03/20/17 2125  . magnesium hydroxide (MILK OF MAGNESIA) suspension 30 mL  30 mL Oral Daily PRN Withrow, John C, FNP      . meloxicam (MOBIC) tablet 15 mg  15 mg Oral Daily Adonis Brook, NP   15 mg at 03/21/17 0751  . multivitamin with minerals tablet 1 tablet  1 tablet Oral Daily Beau Fanny, FNP   1 tablet at 03/21/17 0751  . nicotine (NICODERM CQ - dosed in mg/24 hr) patch 7 mg  7 mg Transdermal Daily Cobos, Rockey Situ, MD   7 mg at 03/21/17 0754  . QUEtiapine (SEROQUEL) tablet 150 mg  150 mg Oral QHS,MR X 1 Nwoko, Agnes I, NP   150 mg at 03/20/17 2217  . QUEtiapine (SEROQUEL) tablet  25 mg  25 mg Oral BID BM PRN Armandina Stammer I, NP   25 mg at 03/20/17 1350  . sertraline (ZOLOFT) tablet 25 mg  25 mg Oral Daily Beau Fanny, FNP   25 mg at 03/21/17 0751  . thiamine (VITAMIN B-1) tablet 100 mg  100 mg Oral Daily Withrow, Everardo All, FNP   100 mg at 03/21/17 1886    Lab Results:  No results found for this or any previous visit (from the past 48 hour(s)).  Blood Alcohol level:  Lab Results  Component Value Date   ETH <5 06/25/2015    Metabolic Disorder Labs: No results found for: HGBA1C, MPG No results found for: PROLACTIN Lab Results  Component Value Date   CHOL 286 (H) 03/15/2017   TRIG  108 03/15/2017   HDL 45 03/15/2017   CHOLHDL 6.4 03/15/2017   VLDL 22 03/15/2017   LDLCALC 219 (H) 03/15/2017    Physical Findings: AIMS: Facial and Oral Movements Muscles of Facial Expression: None, normal Lips and Perioral Area: None, normal Jaw: None, normal Tongue: None, normal,Extremity Movements Upper (arms, wrists, hands, fingers): None, normal Lower (legs, knees, ankles, toes): None, normal, Trunk Movements Neck, shoulders, hips: None, normal, Overall Severity Severity of abnormal movements (highest score from questions above): None, normal Incapacitation due to abnormal movements: None, normal Patient's awareness of abnormal movements (rate only patient's report): No Awareness, Dental Status Current problems with teeth and/or dentures?: Yes Does patient usually wear dentures?: No  CIWA:  CIWA-Ar Total: 3 COWS:  COWS Total Score: 1  Musculoskeletal: Strength & Muscle Tone: within normal limits Gait & Station: normal Patient leans: N/A  Psychiatric Specialty Exam: Physical Exam  Psychiatric:  As above.     Review of Systems  Psychiatric/Behavioral: Positive for depression and substance abuse. Negative for hallucinations and suicidal ideas. The patient is nervous/anxious and has insomnia.   All other systems reviewed and are negative. No vomiting , no  fever, no chills   Blood pressure 113/79, pulse 82, temperature 98.7 F (37.1 C), temperature source Oral, resp. rate 18, height _0  (1.727 m), weight 76.7 kg (169 lb).Body mass index is 25.7 kg/m.  General Appearance:  Fair grooming   Eye Contact:  Fair   Speech: Normal   Volume:  Normal  Mood:  Irritable, dysphoric, depressed   Affect:  Irritable  Thought Process:  Linear and Descriptions of Associations: Intact,   Orientation:  Full (Time, Place, and Person)  Thought Content: denies hallucinations at this time, and does not currently appear internally preoccupied   Suicidal Thoughts:  Reports intermittent suicidal ideations,but denies any intention of suicide or of hurting self on unit, and contracts for safety on unit   Homicidal Thoughts:  No denies homicidal ideations  Memory:  Recent and remote grossly intact   Judgement:  Better  Insight:  Good  Psychomotor Activity:  Decreased no tremors or diaphoresis  Concentration:Normal  Recall:  Recent and remote grossly intact   Fund of Knowledge:  Good  Language:  Good  Akathisia:  No  Handed:    AIMS (if indicated):     Assets:  Communication Skills Desire for Improvement Resilience  ADL's:  Intact  Cognition:  WNL  Sleep:  Number of Hours: 6   Assessment - patient presents irritable , dysphoric, focusing on medication issues, and irritable towards staff. Reports lingering, intermittent suicidal ideations, and states he has had thoughts of shooting self, but at this time contracts for safety on unit, and is future oriented, expressing a desire to go to a program in [place] after he is discharged .  No current WDL symptoms Denies medication side effects but feels that they are not helping as much as he would want .    Treatment Plan Summary:   Reviewed as below today 5/14.   PLAN: Encourage ongoing group and milieu participation to work on coping skills and symptom reduction. Continue to encourage efforts to work on  sobriety and relapse prevention.  Change Seroquel to 25 mgrs BID and150 mg QHS for mood instability/ disorder, psychosis, irritability, insomnia  Increase  Zoloft to 50 mg QDAY for anxiety and for depression Continue Neurontin at 300 mgrs TID for anxiety, pain Continue Ativan 1 mgr Q 6 hours PRN for anxiety or symptoms of WDL  as needed  Will initiate Neurontin 300 mg tid for agitation/alcohol withdrawal syndrome. Treatment team working on disposition planning - see above   Jenne Campus, MD 03/21/2017, 11:34 AM  Patient ID: Leonard Martinez, male   DOB: October 02, 1971, 46 y.o.   MRN: [id number]

## 2017-03-22 MED ORDER — QUETIAPINE FUMARATE 200 MG PO TABS
200.0000 mg | ORAL_TABLET | Freq: Every day | ORAL | Status: DC
  Administered 2017-03-22 – 2017-03-23 (×2): 200 mg via ORAL
  Filled 2017-03-22: qty 21
  Filled 2017-03-22 (×3): qty 1

## 2017-03-22 MED ORDER — LORAZEPAM 0.5 MG PO TABS
0.5000 mg | ORAL_TABLET | Freq: Four times a day (QID) | ORAL | Status: DC | PRN
Start: 2017-03-22 — End: 2017-03-23
  Administered 2017-03-22 – 2017-03-23 (×2): 0.5 mg via ORAL
  Filled 2017-03-22 (×2): qty 1

## 2017-03-22 MED ORDER — GABAPENTIN 400 MG PO CAPS
400.0000 mg | ORAL_CAPSULE | Freq: Three times a day (TID) | ORAL | Status: DC
  Administered 2017-03-22 – 2017-03-24 (×6): 400 mg via ORAL
  Filled 2017-03-22 (×3): qty 1
  Filled 2017-03-22 (×2): qty 63
  Filled 2017-03-22 (×2): qty 1
  Filled 2017-03-22: qty 63
  Filled 2017-03-22 (×3): qty 1

## 2017-03-22 NOTE — Progress Notes (Signed)
Patient ID: Leonard Martinez, male   DOB: May 03, 1971, 46 y.o.   MRN: [id number] D: Client visible on the unit, seen in dayroom interacting with peers, watching TV. Client appears calm, but continues to request medications. Client reports anxiety "7" of 10.  "my day been better, I feel like I owe you an apology, so I apologize" "I just wasn't feeling well" "I got to talk to the doctor and may have been approved for [facility]. "I glad of that cause I want to stay close to my children" A: Writer provided emotional support, medications reviewed, administered as ordered. Staff will monitor q8315min for safety. R: Client is safe on the unit, attended group.

## 2017-03-22 NOTE — Progress Notes (Signed)
Adult Psychoeducational Group Note  Date:  03/22/2017 Time:  9:12 PM  Group Topic/Focus:  Wrap-Up Group:   The focus of this group is to help patients review their daily goal of treatment and discuss progress on daily workbooks.  Participation Level:  Active  Participation Quality:  Appropriate and Attentive  Affect:  Appropriate  Cognitive:  Appropriate  Insight: Appropriate  Engagement in Group:  Engaged  Modes of Intervention:  Discussion  Additional Comments:  Pt stated his day was a 7. Pt stated he participated in groups and is hopeful to get into [facility] tomorrow.  Caswell CorwinOwen, Sibley Rolison C 03/22/2017, 9:12 PM

## 2017-03-22 NOTE — [facility] Group Notes (Signed)
[facility] LCSW Group Therapy 03/22/2017 1:15 PM  Type of Therapy: Group Therapy- Feelings about Diagnosis  Participation Level: Active   Participation Quality:  Appropriate  Affect:  Appropriate  Cognitive: Alert and Oriented   Insight:  Developing   Engagement in Therapy: Developing/Improving and Engaged   Modes of Intervention: Clarification, Confrontation, Discussion, Education, Exploration, Limit-setting, Orientation, Problem-solving, Rapport Building, Dance movement psychotherapisteality Testing, Socialization and Support  Description of Group:   This group will allow patients to explore their thoughts and feelings about diagnoses they have received. Patients will be guided to explore their level of understanding and acceptance of these diagnoses. Facilitator will encourage patients to process their thoughts and feelings about the reactions of others to their diagnosis, and will guide patients in identifying ways to discuss their diagnosis with significant others in their lives. This group will be process-oriented, with patients participating in exploration of their own experiences as well as giving and receiving support and challenge from other group members.  Summary of Progress/Problems:  Pt reports that he has been given the diagnosis of Depression in the past however, he believes that Bipolar Disorder is the diagnosis that is most fitting for him. Pt states that he is looking forward to receiving treatment and he is hopeful that [facility] will have a bed for him soon.  Therapeutic Modalities:   Cognitive Behavioral Therapy Solution Focused Therapy Motivational Interviewing Relapse Prevention Therapy  Leonard JordanLynn B Priscille Shadduck, MSW, [facility] HospitalCSWA 03/22/2017 4:22 PM

## 2017-03-22 NOTE — Progress Notes (Signed)
D: Patient has pleasant mood today; he is significantly less irritable and focused on his medications.  Patient states he is trying to do "without the ativan because I know I cannot discharge with it."  [facility] and is excited about the possibility of getting in.  He continues to report passive SI and "hopes to stay here to get better."  Patient rescinded his 72 hour discharge today at 830.  His goal today is to work with MD about consistency with medications.  Patient states that he gets "frustrated" when his medications are changed.  He reports poor sleep; fair appetite; low energy and poor concentration.  He denies any HI and does not appear to be responding to internal stimuli.  Patient rates his depression and hopelessness as a 9; describes his anxiety as "a lot."  Patient reports withdrawal symptoms as diarrhea, cravings, agitation, cramping, nausea and irritability.   A: Continue to monitor medication management and MD orders.  Safety checks completed every 15 minutes per protocol.  Offer support and encouragement as needed. R: Patient is receptive to staff; his behavior is appropriate.

## 2017-03-22 NOTE — Progress Notes (Addendum)
[facility] MD Progress Note  03/22/2017 11:32 AM Jubal Rademaker  MRN:  [id number]  Subjective:   Patient is feeling better today. States he feels less irritable, less upset, and is stating to feel less depressed. Reports he has frequent mood swings, but that he does feel his mood is starting to stabilize. He is future oriented, and states that although he would prefer to remain in [place] after discharge, may decide to relocate to [place] as he has more services available to him there . Ruminates about not seeing his children ( who live with family ) often. Denies SI. Denies medication side effects at this time.   Objective: I have met with patient and have discussed case with treatment team. Patient presenting with partial improvement, less dysphoric, less irritable, and today more visible on unit, less isolative . No disruptive or agitated behaviors on unit. Denies medication side effects, feels medications are partially effective.  We reviewed medication issues - patient aware that Ativan PRN management for severe anxiety is short term and understands rationale to avoid long standing /open ended BZD use, due to abuse potential. He states he thinks Seroquel , Neurontin, Zoloft, Vistaril are all partially effective for anxiety. Interested in further Seroquel titration to help address ongoing symptoms of mood instability and insomnia. Of note, patient states he thinks he has a bipolar spectrum disorder, and identifies significant mood swings, and days of increased irritability and racing thoughts even without any identifiable trigger. States symptoms worsen with substance abuse, but he feels he has mood disorder even independent from substance abuse . Finds Seroquel is helping decreased mood instability.      Principal Problem:  Substance Induced Psychotic Disorder Diagnosis:   Patient Active Problem List   Diagnosis Date Noted  . Substance induced mood disorder ([place]) [F19.94]   . MDD (major  depressive disorder), recurrent, severe, with psychosis ([place]) [F33.3] 03/13/2017  . Homelessness [Z59.0] 03/12/2017  . MDD (major depressive disorder), single episode, severe , no psychosis ([place]) [F32.2] 06/28/2015  . Cocaine use disorder, mild, abuse [F14.10] 06/28/2015  . Tobacco use disorder [F17.200] 06/28/2015  . Cannabis use disorder, severe, dependence ([place]) [F12.20] 06/28/2015  . Alcohol use disorder, moderate, in sustained remission ([place]) [F10.21] 06/28/2015  . Suicide attempt ([place]) [T14.91XA] 06/27/2015  . Substance-induced psychotic disorder ([place]) [F19.959] 06/26/2015  . Salicylate overdose [J18.841Y] 06/25/2015   Total Time spent with patient: 20 minutes  Past Psychiatric History: As in H&P  Past Medical History:  Past Medical History:  Diagnosis Date  . Anxiety   . Chronic back pain   . Herniated disc   . Hyperlipidemia     Past Surgical History:  Procedure Laterality Date  . NO PAST SURGERIES     Family History:  Family History  Problem Relation Age of Onset  . Bladder Cancer Mother   . Pancreatic cancer Father    Family Psychiatric  History: As in H&P Social History:  History  Alcohol Use  . Yes    Comment: As much as possible, varies with finances     History  Drug Use  . Types: Amphetamines, MDMA (Ecstacy), Marijuana, Cocaine    Comment: intermittent depending on finances    Social History   Social History  . Marital status: Divorced    Spouse name: N/A  . Number of children: N/A  . Years of education: N/A   Social History Main Topics  . Smoking status: Current Every Day Smoker    Packs/day: 3.00    Years:  2.00    Types: Cigarettes  . Smokeless tobacco: Never Used  . Alcohol use Yes     Comment: As much as possible, varies with finances  . Drug use: Yes    Types: Amphetamines, MDMA (Ecstacy), Marijuana, Cocaine     Comment: intermittent depending on finances  . Sexual activity: Yes   Other Topics Concern  . None   Social History  Narrative  . None   Additional Social History:    History of alcohol / drug use?: Yes Longest period of sobriety (when/how long): 12 years Negative Consequences of Use: Financial, Scientist, research (physical sciences), Personal relationships, Work / School Withdrawal Symptoms: Sweats, Tremors Sleep: Fair  Appetite:  improving   Current Medications: Current Facility-Administered Medications  Medication Dose Route Frequency Provider Last Rate Last Dose  . acetaminophen (TYLENOL) tablet 650 mg  650 mg Oral Q6H PRN Benjamine Mola, FNP   650 mg at 03/22/17 0017  . alum & mag hydroxide-simeth (MAALOX/MYLANTA) 200-200-20 MG/5ML suspension 30 mL  30 mL Oral Q4H PRN Withrow, Elyse Jarvis, FNP      . feeding supplement (ENSURE ENLIVE) (ENSURE ENLIVE) liquid 237 mL  237 mL Oral Daily PRN Cobos, Myer Peer, MD   237 mL at 03/18/17 2110  . gabapentin (NEURONTIN) capsule 300 mg  300 mg Oral TID Lindell Spar I, NP   300 mg at 03/22/17 1115  . hydrOXYzine (ATARAX/VISTARIL) tablet 25 mg  25 mg Oral TID PRN Benjamine Mola, FNP   25 mg at 03/22/17 0018  . LORazepam (ATIVAN) tablet 1 mg  1 mg Oral Q6H PRN Artist [place], MD   1 mg at 03/22/17 0954  . magnesium hydroxide (MILK OF MAGNESIA) suspension 30 mL  30 mL Oral Daily PRN Withrow, John C, FNP      . meloxicam (MOBIC) tablet 15 mg  15 mg Oral Daily Kerrie Buffalo, NP   15 mg at 03/22/17 0825  . multivitamin with minerals tablet 1 tablet  1 tablet Oral Daily Withrow, Elyse Jarvis, FNP   1 tablet at 03/22/17 0825  . nicotine (NICODERM CQ - dosed in mg/24 hr) patch 7 mg  7 mg Transdermal Daily Cobos, Myer Peer, MD   Stopped at 03/22/17 [phone]  . QUEtiapine (SEROQUEL) tablet 150 mg  150 mg Oral QHS Cobos, Myer Peer, MD   150 mg at 03/21/17 2132  . QUEtiapine (SEROQUEL) tablet 25 mg  25 mg Oral BID Cobos, Myer Peer, MD   25 mg at 03/22/17 0826  . sertraline (ZOLOFT) tablet 50 mg  50 mg Oral Daily Cobos, Myer Peer, MD   50 mg at 03/22/17 0826  . thiamine (VITAMIN B-1) tablet 100 mg  100 mg  Oral Daily Withrow, Elyse Jarvis, FNP   100 mg at 03/22/17 0825    Lab Results:  No results found for this or any previous visit (from the past 48 hour(s)).  Blood Alcohol level:  Lab Results  Component Value Date   ETH <5 96/02/5408    Metabolic Disorder Labs: No results found for: HGBA1C, MPG No results found for: PROLACTIN Lab Results  Component Value Date   CHOL 286 (H) 03/15/2017   TRIG 108 03/15/2017   HDL 45 03/15/2017   CHOLHDL 6.4 03/15/2017   VLDL 22 03/15/2017   LDLCALC 219 (H) 03/15/2017    Physical Findings: AIMS: Facial and Oral Movements Muscles of Facial Expression: None, normal Lips and Perioral Area: None, normal Jaw: None, normal Tongue: None, normal,Extremity Movements Upper (arms, wrists, hands,  fingers): None, normal Lower (legs, knees, ankles, toes): None, normal, Trunk Movements Neck, shoulders, hips: None, normal, Overall Severity Severity of abnormal movements (highest score from questions above): None, normal Incapacitation due to abnormal movements: None, normal Patient's awareness of abnormal movements (rate only patient's report): No Awareness, Dental Status Current problems with teeth and/or dentures?: Yes Does patient usually wear dentures?: No  CIWA:  CIWA-Ar Total: 3 COWS:  COWS Total Score: 1  Musculoskeletal: Strength & Muscle Tone: within normal limits Gait & Station: normal Patient leans: N/A  Psychiatric Specialty Exam: Physical Exam  Psychiatric:  As above.     Review of Systems  Psychiatric/Behavioral: Positive for depression and substance abuse. Negative for hallucinations and suicidal ideas. The patient is nervous/anxious and has insomnia.   All other systems reviewed and are negative. No vomiting , no fever, no chills   Blood pressure 99/77, pulse 86, temperature 98.9 F (37.2 C), temperature source Oral, resp. rate 16, height 5' 8"  (1.727 m), weight 76.7 kg (169 lb).Body mass index is 25.7 kg/m.  General Appearance:   Fair grooming   Eye Contact:  Fair   Speech: Normal   Volume:  Normal  Mood:  Irritable, dysphoric, depressed   Affect:  Irritable  Thought Process:  Goal Directed and Descriptions of Associations: Intact,   Orientation:  Other:  fully alert and attentive  Thought Content: no hallucinations, no delusions  Suicidal Thoughts:  At this time denies suicidal plan or intention, and contracts for safety on unit   Homicidal Thoughts:  No denies homicidal ideations  Memory:  Recent and remote grossly intact  Judgement:  Improving   Insight:  Improving   Psychomotor Activity:  Normal   Concentration: Normal   Recall:  Recent and remote grossly intact   Fund of Knowledge:  Good   Language:  Good  Akathisia:  Negative  Handed:    AIMS (if indicated):     Assets:  Communication Skills Desire for Improvement Resilience  ADL's:  Intact  Cognition:  WNL  Sleep:  Number of Hours: 4.75   Assessment - patient presenting with partial improvement of mood and range of affect - today less irritable, less dysphoric and less irritable. Denies suicidal plan or intention and is future oriented, thinking of going to a program in [place] after discharge. Thus far tolerating medications well, and in particular feels Seroquel is helping to decrease mood instability/irritability . Sleep remains fair .     Treatment Plan Summary:   Reviewed as below today 5/15.    PLAN: Encourage ongoing group and milieu participation to work on coping skills and symptom reduction. Continue to encourage efforts to work on sobriety and relapse prevention.  Increase  Seroquel to 25 mgrs BID and 200 mg QHS for mood instability/ disorder, psychosis, irritability, insomnia  Continue  Zoloft  50 mg QDAY for anxiety and for depression Increase  Neurontin to 400 mgrs TID for anxiety, pain Decrease  Ativan to 0.5  mgr Q 6 hours PRN for anxiety  Treatment team working on disposition planning - see above   Jenne Campus,  MD 03/22/2017, 11:32 AM  Patient ID: Donzetta Kohut, male   DOB: October 01, 1971, 46 y.o.   MRN: [id number]

## 2017-03-22 NOTE — Progress Notes (Signed)
Pt had a phone screening with an [facility] representative today at 2:30pm. Per Shayla, pt is being considered for admission by their team. Awaiting a decision.   Jonathon JordanLynn B Kiev Labrosse, MSW, Theresia MajorsLCSWA [phone]

## 2017-03-22 NOTE — Progress Notes (Signed)
Recreation Therapy Notes  Animal-Assisted Activity (AAA) Program Checklist/Progress Notes Patient Eligibility Criteria Checklist & Daily Group note for Rec TxIntervention  Date: 05.15.2018 Time: 2:45pm Location: [address] Dayroom    AAA/T Program Assumption of Risk Form signed by Patient/ or Parent Legal Guardian Yes  Patient is free of allergies or sever asthma Yes  Patient reports no fear of animals Yes  Patient reports no history of cruelty to animals Yes  Patient understands his/her participation is voluntary Yes  Patient washes hands before animal contact Yes  Patient washes hands after animal contact Yes  Behavioral Response: Appropriate   Education:Hand Washing, Appropriate Animal Interaction   Education Outcome: Acknowledges education.   Clinical Observations/Feedback: Patient attended session and interacted appropriately with therapy dog and peers.    Leonard Martinez, LRT/CTRS        Leonard Martinez L 03/22/2017 3:00 PM 

## 2017-03-23 MED ORDER — LORAZEPAM 0.5 MG PO TABS
0.5000 mg | ORAL_TABLET | Freq: Two times a day (BID) | ORAL | Status: DC | PRN
  Administered 2017-03-24: 0.5 mg via ORAL
  Filled 2017-03-23: qty 1

## 2017-03-23 MED ORDER — TRAZODONE HCL 50 MG PO TABS
50.0000 mg | ORAL_TABLET | Freq: Every evening | ORAL | Status: DC | PRN
  Administered 2017-03-23: 50 mg via ORAL
  Filled 2017-03-23: qty 1
  Filled 2017-03-23: qty 21

## 2017-03-23 MED ORDER — HYDROXYZINE HCL 50 MG PO TABS
50.0000 mg | ORAL_TABLET | Freq: Three times a day (TID) | ORAL | Status: DC | PRN
  Administered 2017-03-23 – 2017-03-24 (×3): 50 mg via ORAL
  Filled 2017-03-23 (×3): qty 1

## 2017-03-23 NOTE — Progress Notes (Signed)
Recreation Therapy Notes  Date: 03/23/17 Time: 0930 Location: [address] Dayroom  Group Topic: Stress Management  Goal Area(s) Addresses:  Patient will verbalize importance of using healthy stress management.  Patient will identify positive emotions associated with healthy stress management.   Intervention: Stress Management  Activity :  Body Scan Meditation.  LRT introduced the stress management technique of meditation.  LRT played a meditation from the Calm app to allow patients the opportunity to scan and acknowledge the sensations and tension within the body.  Patients were to follow along as the meditation played to engage in the activity.  Education:  Stress Management, Discharge Planning.   Education Outcome: Acknowledges edcuation/In group clarification offered/Needs additional education  Clinical Observations/Feedback: Pt did not attend group.   Caroll RancherMarjette Teanna Elem, LRT/CTRS         Caroll RancherLindsay, Negin Hegg A 03/23/2017 12:56 PM

## 2017-03-23 NOTE — Progress Notes (Signed)
Adult Psychoeducational Group Note  Date:  03/23/2017 Time:  9:47 PM  Group Topic/Focus:  Wrap-Up Group:   The focus of this group is to help patients review their daily goal of treatment and discuss progress on daily workbooks.  Participation Level:  Active  Participation Quality:  Appropriate and Attentive  Affect:  Appropriate  Cognitive:  Appropriate  Insight: Appropriate  Engagement in Group:  Engaged  Modes of Intervention:  Discussion  Additional Comments:  Pt stated his goal is to stay sober. Pt identified 2 coping skills: 1) good conversation and prayer, 2) pt initially identified medication as a coping skill then changed this to getting good exercise.  Leonard Martinez, Leonard Martinez 03/23/2017, 9:47 PM

## 2017-03-23 NOTE — [facility] Group Notes (Signed)
[facility] LCSW Group Therapy 03/23/2017 1:15 PM  Type of Therapy: Group Therapy- Emotion Regulation  Participation Level: Active   Participation Quality:  Appropriate  Affect: Appropriate  Cognitive: Alert and Oriented   Insight:  Developing/Improving  Engagement in Therapy: Developing/Improving and Engaged   Modes of Intervention: Clarification, Confrontation, Discussion, Education, Exploration, Limit-setting, Orientation, Problem-solving, Rapport Building, Dance movement psychotherapisteality Testing, Socialization and Support  Summary of Progress/Problems: The topic for group today was emotional regulation. This group focused on both positive and negative emotion identification and allowed group members to process ways to identify feelings, regulate negative emotions, and find healthy ways to manage internal/external emotions. Group members were asked to reflect on a time when their reaction to an emotion led to a negative outcome and explored how alternative responses using emotion regulation would have benefited them. Group members were also asked to discuss a time when emotion regulation was utilized when a negative emotion was experienced. Pt reports that his emotions have felt more manageable the past few days. Pt states that he was in a bad mood a few days ago and admits that he could've handled the situation a lot better than he did. Pt apologized for his actions and displayed a great level of insight.   Leonard JordanLynn Martinez Leonard Martinez, MSW, LCSWA 03/23/2017 4:49 PM

## 2017-03-23 NOTE — Plan of Care (Signed)
Problem: Safety: Goal: Periods of time without injury will increase Outcome: Progressing Pt. remains a low fall risk, denies SI/HI/AVH at this time, Q 15 checks in effect.    

## 2017-03-23 NOTE — Tx Team (Signed)
Interdisciplinary Treatment and Diagnostic Plan Update 03/23/2017 Time of Session: 9:30am  Leonard Martinez  MRN: [id number]  Principal Diagnosis: MDD (major depressive disorder), recurrent, severe, with psychosis (HCC)  Secondary Diagnoses: Principal Problem:   MDD (major depressive disorder), recurrent, severe, with psychosis (HCC) Active Problems:   Substance-induced psychotic disorder (HCC)   Cocaine use disorder, mild, abuse   Homelessness   Substance induced mood disorder (HCC)   Current Medications:  Current Facility-Administered Medications  Medication Dose Route Frequency Provider Last Rate Last Dose  . acetaminophen (TYLENOL) tablet 650 mg  650 mg Oral Q6H PRN Beau Fanny, FNP   650 mg at 03/22/17 2322  . alum & mag hydroxide-simeth (MAALOX/MYLANTA) 200-200-20 MG/5ML suspension 30 mL  30 mL Oral Q4H PRN Withrow, Everardo All, FNP      . feeding supplement (ENSURE ENLIVE) (ENSURE ENLIVE) liquid 237 mL  237 mL Oral Daily PRN Cobos, Rockey Situ, MD   237 mL at 03/18/17 2110  . gabapentin (NEURONTIN) capsule 400 mg  400 mg Oral TID Cobos, Rockey Situ, MD   400 mg at 03/23/17 0839  . hydrOXYzine (ATARAX/VISTARIL) tablet 25 mg  25 mg Oral TID PRN Beau Fanny, FNP   25 mg at 03/22/17 2208  . LORazepam (ATIVAN) tablet 0.5 mg  0.5 mg Oral Q6H PRN Cobos, Rockey Situ, MD   0.5 mg at 03/23/17 0841  . magnesium hydroxide (MILK OF MAGNESIA) suspension 30 mL  30 mL Oral Daily PRN Withrow, John C, FNP      . meloxicam (MOBIC) tablet 15 mg  15 mg Oral Daily Adonis Brook, NP   15 mg at 03/23/17 0840  . multivitamin with minerals tablet 1 tablet  1 tablet Oral Daily Beau Fanny, FNP   1 tablet at 03/23/17 [phone]  . nicotine (NICODERM CQ - dosed in mg/24 hr) patch 7 mg  7 mg Transdermal Daily Cobos, Rockey Situ, MD   7 mg at 03/23/17 0839  . QUEtiapine (SEROQUEL) tablet 200 mg  200 mg Oral QHS Cobos, Rockey Situ, MD   200 mg at 03/22/17 2125  . QUEtiapine (SEROQUEL) tablet 25 mg  25 mg Oral BID  Cobos, Rockey Situ, MD   25 mg at 03/23/17 0840  . sertraline (ZOLOFT) tablet 50 mg  50 mg Oral Daily Cobos, Rockey Situ, MD   50 mg at 03/23/17 0840  . thiamine (VITAMIN B-1) tablet 100 mg  100 mg Oral Daily Beau Fanny, FNP   100 mg at 03/23/17 1191    PTA Medications: Prescriptions Prior to Admission  Medication Sig Dispense Refill Last Dose  . acetaminophen (TYLENOL) 500 MG tablet Take 1,000 mg by mouth every 6 (six) hours as needed for mild pain.   Past Month at Unknown time  . methocarbamol (ROBAXIN) 500 MG tablet Take 1-2 tablets (500-1,000 mg total) by mouth every 6 (six) hours as needed. 20 tablet 0 Past Week at Unknown time  . [EXPIRED] penicillin v potassium (VEETID) 500 MG tablet Take 1 tablet (500 mg total) by mouth 4 (four) times daily. 28 tablet 0 Past Week at Unknown time    Treatment Modalities: Medication Management, Group therapy, Case management,  1 to 1 session with clinician, Psychoeducation, Recreational therapy.  Patient Stressors: Financial difficulties Health problems Loss of family support Substance abuse Patient Strengths: Geographical information systems officer for treatment/growth Other: Previous history of sobriety  Physician Treatment Plan for Primary Diagnosis: MDD (major depressive disorder), recurrent, severe, with psychosis (HCC) Long Term Goal(s):  Improvement in symptoms so as ready for discharge Short Term Goals: Ability to identify changes in lifestyle to reduce recurrence of condition will improve Ability to verbalize feelings will improve Ability to disclose and discuss suicidal ideas Ability to demonstrate self-control will improve Ability to identify and develop effective coping behaviors will improve Ability to maintain clinical measurements within normal limits will improve Compliance with prescribed medications will improve Ability to identify triggers associated with substance abuse/mental health issues will improve Ability to identify  changes in lifestyle to reduce recurrence of condition will improve Ability to verbalize feelings will improve Ability to disclose and discuss suicidal ideas Ability to demonstrate self-control will improve Ability to identify and develop effective coping behaviors will improve Ability to maintain clinical measurements within normal limits will improve Compliance with prescribed medications will improve Ability to identify triggers associated with substance abuse/mental health issues will improve  Medication Management: Evaluate patient's response, side effects, and tolerance of medication regimen.  Therapeutic Interventions: 1 to 1 sessions, Unit Group sessions and Medication administration.  Evaluation of Outcomes: Progressing  Physician Treatment Plan for Secondary Diagnosis: Principal Problem:   MDD (major depressive disorder), recurrent, severe, with psychosis (HCC) Active Problems:   Substance-induced psychotic disorder (HCC)   Cocaine use disorder, mild, abuse   Homelessness   Substance induced mood disorder (HCC)  Long Term Goal(s): Improvement in symptoms so as ready for discharge  Short Term Goals: Ability to identify changes in lifestyle to reduce recurrence of condition will improve Ability to verbalize feelings will improve Ability to disclose and discuss suicidal ideas Ability to demonstrate self-control will improve Ability to identify and develop effective coping behaviors will improve Ability to maintain clinical measurements within normal limits will improve Compliance with prescribed medications will improve Ability to identify triggers associated with substance abuse/mental health issues will improve Ability to identify changes in lifestyle to reduce recurrence of condition will improve Ability to verbalize feelings will improve Ability to disclose and discuss suicidal ideas Ability to demonstrate self-control will improve Ability to identify and develop  effective coping behaviors will improve Ability to maintain clinical measurements within normal limits will improve Compliance with prescribed medications will improve Ability to identify triggers associated with substance abuse/mental health issues will improve  Medication Management: Evaluate patient's response, side effects, and tolerance of medication regimen.  Therapeutic Interventions: 1 to 1 sessions, Unit Group sessions and Medication administration.  Evaluation of Outcomes: Progressing  RN Treatment Plan for Primary Diagnosis: MDD (major depressive disorder), recurrent, severe, with psychosis (HCC) Long Term Goal(s): Knowledge of disease and therapeutic regimen to maintain health will improve  Short Term Goals: Ability to remain free from injury will improve and Compliance with prescribed medications will improve  Medication Management: RN will administer medications as ordered by provider, will assess and evaluate patient's response and provide education to patient for prescribed medication. RN will report any adverse and/or side effects to prescribing provider.  Therapeutic Interventions: 1 on 1 counseling sessions, Psychoeducation, Medication administration, Evaluate responses to treatment, Monitor vital signs and CBGs as ordered, Perform/monitor CIWA, COWS, AIMS and Fall Risk screenings as ordered, Perform wound care treatments as ordered.  Evaluation of Outcomes: Progressing  LCSW Treatment Plan for Primary Diagnosis: MDD (major depressive disorder), recurrent, severe, with psychosis (HCC) Long Term Goal(s): Safe transition to appropriate next level of care at discharge, Engage patient in therapeutic group addressing interpersonal concerns. Short Term Goals: Engage patient in aftercare planning with referrals and resources, Facilitate patient progression through stages of change  regarding substance use diagnoses and concerns, Identify triggers associated with mental  health/substance abuse issues and Increase skills for wellness and recovery  Therapeutic Interventions: Assess for all discharge needs, 1 to 1 time with Social worker, Explore available resources and support systems, Assess for adequacy in community support network, Educate family and significant other(s) on suicide prevention, Complete Psychosocial Assessment, Interpersonal group therapy.  Evaluation of Outcomes: Progressing  Progress in Treatment: Attending groups: Yes Participating in groups: Yes  Taking medication as prescribed: Yes, MD continues to assess for medication changes as needed Toleration medication: Yes, no side effects reported at this time Family/Significant other contact made: No, pt declined contact. Patient understands diagnosis: Developing inisght Discussing patient identified problems/goals with staff: Yes Medical problems stabilized or resolved: Yes Denies suicidal/homicidal ideation: No, pt endorses passive SI. Issues/concerns per patient self-inventory: None Other: N/A  New problem(s) identified: None identified at this time.   New Short Term/Long Term Goal(s): None identified at this time.   Discharge Plan or Barriers: Pt will discharge to residential treatment. [facility] referral and [facility] referral made 5/7.   03/23/17: Pt has an [facility] phone screening on 5/15. Possible bed available for tomorrow. Awaiting decision from [facility].  Reason for Continuation of Hospitalization:  Anxiety  Depression Medication stabilization Suicidal ideation Withdrawal symptoms  Estimated Length of Stay: 1-3 days  Attendees: Patient: 03/23/2017 9:47 AM  Physician: Dr. Jama Flavors 03/23/2017 9:47 AM  Nursing: Meriam Sprague RN; [place], RN 03/23/2017 9:47 AM  RN Care Manager: Onnie Boer, RN 03/23/2017 9:47 AM  Social Worker: Donnelly Stager, LCSWA 03/23/2017 9:47 AM  Recreational Therapist:  03/23/2017 9:47 AM  Other: Armandina Stammer, NP 03/23/2017 9:47 AM  Other:  03/23/2017 9:47 AM  Other:  03/23/2017 9:47 AM   Scribe for Treatment Team: Jonathon Jordan, MSW,LCSWA 03/23/2017 9:47 AM

## 2017-03-23 NOTE — Progress Notes (Signed)
Patient ID: Leonard Martinez, male   DOB: 1970-11-17, 46 y.o.   MRN: [id number]  DAR: Pt. Denies SI/HI and A/V Hallucinations. He reports that [facility]. He reports that he always has anxiety but is trying to "ween" off his Ativan as he is aware [facility]. He is seen in the milieu interacting with his peers and attending groups. He does appear to be medication focused and medication seeking. Support and encouragement provided to the patient. Scheduled and PRN medications administered to patient per physician's orders. Q15 minute checks are maintained for safety.

## 2017-03-23 NOTE — Progress Notes (Signed)
[facility] MD Progress Note  03/23/2017 1:44 PM Jaquail Mclees  MRN:  [id number]  Subjective:   Patient states he is feeling better. He states he is feeling less irritable, less " grumpy" and less " down on myself ". He does continue to feel depressed , but to a lesser degree than on admission, and is future oriented. At this time he is future oriented, thinking of going to [facility] ( rehab setting ) at discharge. Denies medication side effects, but states that his anxiety symptoms are still significant- he is trying to limit /avoid Ativan PRNs as he is aware of abuse potential and that he is not going to be continued on BZD on an open ended basis. States Vistaril only partially helpful, but well tolerated at current dose.    Objective: I have met with patient and have discussed case with treatment team. In general patient is reporting significant improvement compared to admission. Today presents pleasant, interactive, and irritability, anger noted earlier this week is much improved. He is more future oriented, and as above, focusing more on disposition planning options. Denies suicidal ideations. Denies medication side effects.       Principal Problem:  Substance Induced Psychotic Disorder Diagnosis:   Patient Active Problem List   Diagnosis Date Noted  . Substance induced mood disorder ([place]) [F19.94]   . MDD (major depressive disorder), recurrent, severe, with psychosis ([place]) [F33.3] 03/13/2017  . Homelessness [Z59.0] 03/12/2017  . MDD (major depressive disorder), single episode, severe , no psychosis ([place]) [F32.2] 06/28/2015  . Cocaine use disorder, mild, abuse [F14.10] 06/28/2015  . Tobacco use disorder [F17.200] 06/28/2015  . Cannabis use disorder, severe, dependence ([place]) [F12.20] 06/28/2015  . Alcohol use disorder, moderate, in sustained remission ([place]) [F10.21] 06/28/2015  . Suicide attempt ([place]) [T14.91XA] 06/27/2015  . Substance-induced psychotic disorder ([place]) [F19.959] 06/26/2015  .  Salicylate overdose [Q76.226J] 06/25/2015   Total Time spent with patient: 20 minutes  Past Psychiatric History: As in H&P  Past Medical History:  Past Medical History:  Diagnosis Date  . Anxiety   . Chronic back pain   . Herniated disc   . Hyperlipidemia     Past Surgical History:  Procedure Laterality Date  . NO PAST SURGERIES     Family History:  Family History  Problem Relation Age of Onset  . Bladder Cancer Mother   . Pancreatic cancer Father    Family Psychiatric  History: As in H&P Social History:  History  Alcohol Use  . Yes    Comment: As much as possible, varies with finances     History  Drug Use  . Types: Amphetamines, MDMA (Ecstacy), Marijuana, Cocaine    Comment: intermittent depending on finances    Social History   Social History  . Marital status: Divorced    Spouse name: N/A  . Number of children: N/A  . Years of education: N/A   Social History Main Topics  . Smoking status: Current Every Day Smoker    Packs/day: 3.00    Years: 2.00    Types: Cigarettes  . Smokeless tobacco: Never Used  . Alcohol use Yes     Comment: As much as possible, varies with finances  . Drug use: Yes    Types: Amphetamines, MDMA (Ecstacy), Marijuana, Cocaine     Comment: intermittent depending on finances  . Sexual activity: Yes   Other Topics Concern  . None   Social History Narrative  . None   Additional Social History:    History  of alcohol / drug use?: Yes Longest period of sobriety (when/how long): 12 years Negative Consequences of Use: Financial, Scientist, research (physical sciences), Personal relationships, Work / School Withdrawal Symptoms: Sweats, Tremors Sleep: fair- improving but still waking up often during night   Appetite:  Good  Current Medications: Current Facility-Administered Medications  Medication Dose Route Frequency Provider Last Rate Last Dose  . acetaminophen (TYLENOL) tablet 650 mg  650 mg Oral Q6H PRN Benjamine Mola, FNP   650 mg at 03/23/17 1148  .  alum & mag hydroxide-simeth (MAALOX/MYLANTA) 200-200-20 MG/5ML suspension 30 mL  30 mL Oral Q4H PRN Withrow, Elyse Jarvis, FNP      . feeding supplement (ENSURE ENLIVE) (ENSURE ENLIVE) liquid 237 mL  237 mL Oral Daily PRN Marcene Laskowski, Myer Peer, MD   237 mL at 03/18/17 2110  . gabapentin (NEURONTIN) capsule 400 mg  400 mg Oral TID Adolph Clutter, Myer Peer, MD   400 mg at 03/23/17 1147  . hydrOXYzine (ATARAX/VISTARIL) tablet 50 mg  50 mg Oral TID PRN Gable Odonohue, Myer Peer, MD      . LORazepam (ATIVAN) tablet 0.5 mg  0.5 mg Oral Q12H PRN Samaiya Awadallah, Myer Peer, MD      . magnesium hydroxide (MILK OF MAGNESIA) suspension 30 mL  30 mL Oral Daily PRN Withrow, John C, FNP      . meloxicam (MOBIC) tablet 15 mg  15 mg Oral Daily Kerrie Buffalo, NP   15 mg at 03/23/17 0840  . multivitamin with minerals tablet 1 tablet  1 tablet Oral Daily Benjamine Mola, FNP   1 tablet at 03/23/17 [phone]  . nicotine (NICODERM CQ - dosed in mg/24 hr) patch 7 mg  7 mg Transdermal Daily Rayhana Slider, Myer Peer, MD   7 mg at 03/23/17 0839  . QUEtiapine (SEROQUEL) tablet 200 mg  200 mg Oral QHS Eliga Arvie, Myer Peer, MD   200 mg at 03/22/17 2125  . QUEtiapine (SEROQUEL) tablet 25 mg  25 mg Oral BID Ho Parisi, Myer Peer, MD   25 mg at 03/23/17 0840  . sertraline (ZOLOFT) tablet 50 mg  50 mg Oral Daily Livianna Petraglia, Myer Peer, MD   50 mg at 03/23/17 0840  . thiamine (VITAMIN B-1) tablet 100 mg  100 mg Oral Daily Benjamine Mola, FNP   100 mg at 03/23/17 5374  . traZODone (DESYREL) tablet 50 mg  50 mg Oral QHS PRN Master Touchet, Myer Peer, MD        Lab Results:  No results found for this or any previous visit (from the past 48 hour(s)).  Blood Alcohol level:  Lab Results  Component Value Date   ETH <5 82/70/7867    Metabolic Disorder Labs: No results found for: HGBA1C, MPG No results found for: PROLACTIN Lab Results  Component Value Date   CHOL 286 (H) 03/15/2017   TRIG 108 03/15/2017   HDL 45 03/15/2017   CHOLHDL 6.4 03/15/2017   VLDL 22 03/15/2017   LDLCALC 219  (H) 03/15/2017    Physical Findings: AIMS: Facial and Oral Movements Muscles of Facial Expression: None, normal Lips and Perioral Area: None, normal Jaw: None, normal Tongue: None, normal,Extremity Movements Upper (arms, wrists, hands, fingers): None, normal Lower (legs, knees, ankles, toes): None, normal, Trunk Movements Neck, shoulders, hips: None, normal, Overall Severity Severity of abnormal movements (highest score from questions above): None, normal Incapacitation due to abnormal movements: None, normal Patient's awareness of abnormal movements (rate only patient's report): No Awareness, Dental Status Current problems with teeth and/or dentures?: Yes Does  patient usually wear dentures?: No  CIWA:  CIWA-Ar Total: 3 COWS:  COWS Total Score: 1  Musculoskeletal: Strength & Muscle Tone: within normal limits Gait & Station: normal Patient leans: N/A  Psychiatric Specialty Exam: Physical Exam  Psychiatric:  As above.     Review of Systems  Psychiatric/Behavioral: Positive for depression and substance abuse. Negative for hallucinations and suicidal ideas. The patient is nervous/anxious and has insomnia.   All other systems reviewed and are negative. no chest pain, no dyspnea  Blood pressure 120/82, pulse 79, temperature 97.8 F (36.6 C), temperature source Oral, resp. rate 18, height _0  (1.727 m), weight 76.7 kg (169 lb).Body mass index is 25.7 kg/m.  General Appearance: improved grooming   Eye Contact:  Good   Speech: Normal   Volume:  Normal   Mood:  Mood is improving, characterizes it as " better", affect more reactive, not irritable today  Affect:  More reactive   Thought Process:  Linear and Descriptions of Associations: Intact,   Orientation:  Full (Time, Place, and Person)  Thought Content: no psychotic symptoms  Suicidal Thoughts:  No - denies suicidal or self injurious ideations  Homicidal Thoughts:  No denies homicidal ideations  Memory:  Recent and remote  grossly intact   Judgement: improving    Insight:  Improving   Psychomotor Activity: Normal   Concentration: normal  Recall:  Good   Fund of Knowledge:  Good   Language:  Good  Akathisia:  No  Handed:    AIMS (if indicated):     Assets:  Communication Skills Desire for Improvement Resilience  ADL's:  Intact  Cognition:  WNL  Sleep:  Number of Hours: 6   Assessment - patient is improving compared to admission, and mood/ range of affect appear significantly improved. At present he is no longer presenting with the irritability/dysphoria he was exhibiting earlier and is becoming more future oriented, wanting to go to a rehab at discharge. Tolerating medications well, but interested in increasing Vistaril PRN dosing to further address anxiety.     Treatment Plan Summary:   Reviewed as below today 5/16    PLAN: Encourage ongoing group and milieu participation to work on coping skills and symptom reduction. Continue to encourage efforts to work on sobriety and relapse prevention.  Continue Seroquel to 25 mgrs BID and 200 mg QHS for mood instability/ disorder, psychosis, irritability, insomnia  Continue  Zoloft  50 mg QDAY for anxiety and for depression Continue  Neurontin to 400 mgrs TID for anxiety, pain Increase Vistaril to 50 mgrs Q 8 hours PRN for anxiety as needed  Start Trazodone 50 mgrs QHS PRN for insomnia if needed Decrease  Ativan to 0.5  mgr Q 12 hours PRN for anxiety  Treatment team working on disposition planning - patient expressing interest in going to [place], MD 03/23/2017, 1:44 PM  Patient ID: Donzetta Kohut, male   DOB: 07-21-1971, 46 y.o.   MRN: [id number]

## 2017-03-24 MED ORDER — QUETIAPINE FUMARATE 25 MG PO TABS: 25.0000 mg | ORAL_TABLET | Freq: Two times a day (BID) | ORAL | 0 refills | Status: AC

## 2017-03-24 MED ORDER — GABAPENTIN 400 MG PO CAPS: 400.0000 mg | ORAL_CAPSULE | Freq: Three times a day (TID) | ORAL | 0 refills | Status: AC

## 2017-03-24 MED ORDER — THIAMINE HCL 100 MG PO TABS: 100.0000 mg | ORAL_TABLET | Freq: Every day | ORAL | 0 refills | Status: AC

## 2017-03-24 MED ORDER — QUETIAPINE FUMARATE 200 MG PO TABS: 200.0000 mg | ORAL_TABLET | Freq: Every day | ORAL | 0 refills | Status: AC

## 2017-03-24 MED ORDER — TRAZODONE HCL 50 MG PO TABS: 50.0000 mg | ORAL_TABLET | Freq: Every evening | ORAL | 0 refills | Status: AC | PRN

## 2017-03-24 MED ORDER — SERTRALINE HCL 50 MG PO TABS: 50.0000 mg | ORAL_TABLET | Freq: Every day | ORAL | 0 refills | Status: AC

## 2017-03-24 NOTE — Progress Notes (Signed)
  [facility] :  Will you be returning to the same living situation after discharge:  No. [facility]. At discharge, do you have transportation home?: Yes,  [facility] representative will pick pt up. Do you have the ability to pay for your medications: Yes,  prescriptions and samples provided.  Release of information consent forms completed and in the chart;  Patient's signature needed at discharge.  Patient to Follow up at: Follow-up Information    [facility]. Go on 03/24/2017.   Why:  You have been accepted for residential treatment. Please be ready for transport by 11AM. Please be sure that you also have a 21 day supply of medications. Thank you.  Contact information: [address] [place], [place]: [phone] F: [phone]       [facility] Follow up.   Specialty:  Behavioral Health Why:  Please go for a walk-in appointment within 1-[facility]. Walk-in hours are Mon-Fri 8 AM- 3 PM. Please arrive as early as possible to be sure that you are seen. Contact information: [address] [place] [address] [phone]           Next level of care provider has access to [facility] Link:no  Safety Planning and Suicide Prevention discussed: Yes,  with pt.  Have you used any form of tobacco in the last 30 days? (Cigarettes, Smokeless Tobacco, Cigars, and/or Pipes): Yes  Has patient been referred to the Quitline?: Patient refused referral  Patient has been referred for addiction treatment: Yes  Jonathon JordanLynn B Kirby Argueta, MSW, LCSWA 03/24/2017, 10:17 AM

## 2017-03-24 NOTE — Progress Notes (Signed)
  DATA ACTION RESPONSE  Objective- Pt. is visible in the dayroom, seen interacting with peers and watching TV. Presents with an animated/anxious affect and mood. No abnormal s/s or further c/o. Subjective- Denies having any SI/HI/AVH/Pain at this time. Pt. states " [facility] Pt is trying to taper off of Ativan, requested for vistaril instead. Is cooperative and remain safe on the unit.  1:1 interaction in private to establish rapport. Encouragement, education, & support given from staff. PRN Vistaril and Trazdone requested and will re-eval accordingly.   Safety maintained with Q 15 checks. Continues to follow treatment plan and will monitor closely. No additonal questions/concerns noted.

## 2017-03-24 NOTE — Progress Notes (Signed)
[facility] All items returned. D/C instructions given, prescriptions given and samples given.

## 2017-03-24 NOTE — Progress Notes (Signed)
[facility] Group Notes:  (Nursing/MHT/Case Management/Adjunct)  Date:  03/24/2017  Time:  11:31 AM  Type of Therapy:  Nurse Education  Participation Level:  Active  Participation Quality:  Appropriate and Supportive  Affect:  Appropriate  Cognitive:  Alert and Oriented  Insight:  Appropriate  Engagement in Group:  Engaged and Supportive  Modes of Intervention:  Discussion, Socialization and Support  Summary of Progress/Problems:The purpose of this group was to support patient's in developing a goal for today. Pt's goal is to work on prayer and meditation. [facility]. Beatrix ShipperWright, Elaine Roanhorse Martin 03/24/2017, 11:31 AM

## 2017-03-24 NOTE — [facility] Suicide Risk Assessment (Signed)
[facility] Discharge Suicide Risk Assessment   Principal Problem: MDD (major depressive disorder), recurrent, severe, with psychosis (HCC) Discharge Diagnoses:  Patient Active Problem List   Diagnosis Date Noted  . Substance induced mood disorder (HCC) [F19.94]   . MDD (major depressive disorder), recurrent, severe, with psychosis (HCC) [F33.3] 03/13/2017  . Homelessness [Z59.0] 03/12/2017  . MDD (major depressive disorder), single episode, severe , no psychosis (HCC) [F32.2] 06/28/2015  . Cocaine use disorder, mild, abuse [F14.10] 06/28/2015  . Tobacco use disorder [F17.200] 06/28/2015  . Cannabis use disorder, severe, dependence (HCC) [F12.20] 06/28/2015  . Alcohol use disorder, moderate, in sustained remission (HCC) [F10.21] 06/28/2015  . Suicide attempt (HCC) [T14.91XA] 06/27/2015  . 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 Salicylate overdose [T39.091A] 06/25/2015    Total Time spent with patient: 30 minutes  Musculoskeletal: Strength & Muscle Tone: within normal limits Gait & Station: normal Patient leans: N/A  Psychiatric Specialty Exam: ROS denies headache, no shortness of breath, no vomiting   Blood pressure 94/63, pulse 92, temperature 97.9 F (36.6 C), temperature source Oral, resp. rate 16, height 5\' 8"  (1.727 m), weight 76.7 kg (169 lb).Body mass index is 25.7 kg/m.  General Appearance: Well Groomed  Eye Contact::  Good  Speech:  Normal Rate409  Volume:  Normal  Mood:  improved mood, presents euthymic  Affect:  Appropriate  Thought Process:  Linear and Descriptions of Associations: Intact  Orientation:  Full (Time, Place, and Person)  Thought Content:  denies hallucinations, no delusions  Suicidal Thoughts:  No denies any suicidal ideations, denies any homicidal or violent ideations   Homicidal Thoughts:  No  Memory:  recent and remote grossly intact   Judgement:  Other:  improving  Insight:  improving   Psychomotor Activity:  Normal   Concentration:  Good  Recall:  Good  Fund of Knowledge:Good  Language: Good  Akathisia:  Negative  Handed:  Right  AIMS (if indicated):     Assets:  Communication Skills Desire for Improvement Resilience  Sleep:  Number of Hours: 5.75  Cognition: WNL  ADL's:  Intact   Mental Status Per Nursing Assessment::   On Admission:     Demographic Factors:  46 year old divorced  male   Loss Factors: Divorced x 1-2 years ago, not seeing his children often, recently relocated from [place].  Historical Factors: History of polysubstance abuse, history of depression, history of prior admissions   Risk Reduction Factors:   Sense of responsibility to family and Positive coping skills or problem solving skills  Continued Clinical Symptoms:  At this time patient is improved compared to admission. Mood improved , affect more reactive, no longer irritable or dysphoric, no suicidal ideations, no homicidal ideations, no psychotic symptoms, future oriented. No residual withdrawal symptoms Behavior on unit in good control  Cognitive Features That Contribute To Risk:  No gross cognitive deficits noted upon discharge. Is alert , attentive, and oriented x 3   Suicide Risk:  Mild:  Suicidal ideation of limited frequency, intensity, duration, and specificity.  There are no identifiable plans, no associated intent, mild dysphoria and related symptoms, good self-control (both objective and subjective assessment), few other risk factors, and identifiable protective factors, including available and accessible social support.  Follow-up Information    [facility]. Go on 03/24/2017.   Why:  You have been accepted for residential treatment. Please be ready for transport by 11AM. Please be sure that you also have a 21 day supply of medications. Thank you.  Contact  information: [address] [place], [place]: [phone] F: [phone]       [facility] Follow up.   Specialty:  Behavioral Health Why:   Please go for a walk-in appointment [facility] Walk-in hours are Mon-Fri 8 AM- 3 PM. Please arrive as early as possible to be sure that you are seen. Contact information: [address] [place] [address] [phone]           Plan Of Care/Follow-up recommendations:  Activity:  as tolerated  Diet:  Regular Tests:  NA Other:  See below  Patient is leaving unit in good spirits  [facility] after which [facility]   Elnoria Livingston A Safaa Stingley, MD 03/24/2017, 11:11 AM

## 2017-03-25 NOTE — Discharge Summary (Signed)
Physician Discharge Summary Note  Patient:  Leonard Martinez is an 46 y.o., male MRN:  [id number] DOB:  12-Mar-1971 Patient phone:  [phone] (home)  Patient address:   [address] [place] [address],  Total Time spent with patient: 30 minutes  Date of Admission:  03/12/2017 Date of Discharge: 03/24/2017  Reason for Admission:  Suicidal ideation   Principal Problem: MDD (major depressive disorder), recurrent, severe, with psychosis [facility]) Discharge Diagnoses: Patient Active Problem List   Diagnosis Date Noted  . Substance induced mood disorder (HCC) [F19.94]   . MDD (major depressive disorder), recurrent, severe, with psychosis (HCC) [F33.3] 03/13/2017  . Homelessness [Z59.0] 03/12/2017  . MDD (major depressive disorder), single episode, severe , no psychosis (HCC) [F32.2] 06/28/2015  . Cocaine use disorder, mild, abuse [F14.10] 06/28/2015  . Tobacco use disorder [F17.200] 06/28/2015  . Cannabis use disorder, severe, dependence (HCC) [F12.20] 06/28/2015  . Alcohol use disorder, moderate, in sustained remission (HCC) [F10.21] 06/28/2015  . Suicide attempt (HCC) [T14.91XA] 06/27/2015  . 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 Salicylate overdose [T39.091A] 06/25/2015    Past Psychiatric History: see HPI  Past Medical History:  Past Medical History:  Diagnosis Date  . Anxiety   . Chronic back pain   . Herniated disc   . Hyperlipidemia     Past Surgical History:  Procedure Laterality Date  . NO PAST SURGERIES     Family History:  Family History  Problem Relation Age of Onset  . Bladder Cancer Mother   . Pancreatic cancer Father    Family Psychiatric  History: see HPI Social History:  History  Alcohol Use  . Yes    Comment: As much as possible, varies with finances     History  Drug Use  . Types: Amphetamines, MDMA (Ecstacy), Marijuana, Cocaine    Comment: intermittent depending on finances    Social History   Social History   . Marital status: Divorced    Spouse name: N/A  . Number of children: N/A  . Years of education: N/A   Social History Main Topics  . Smoking status: Current Every Day Smoker    Packs/day: 3.00    Years: 2.00    Types: Cigarettes  . Smokeless tobacco: Never Used  . Alcohol use Yes     Comment: As much as possible, varies with finances  . Drug use: Yes    Types: Amphetamines, MDMA (Ecstacy), Marijuana, Cocaine     Comment: intermittent depending on finances  . Sexual activity: Yes   Other Topics Concern  . None   Social History Narrative  . None    Hospital Course:  Leonard Martinez an 46 y.o.divorced malewho presented unaccompanied to [facility] ED reporting substance abuse and depressive symptoms, including suicidal ideation.    Leonard Martinez was admitted for MDD (major depressive disorder), recurrent, severe, with psychosis (HCC) and crisis management.  Patient was treated with medications with their indications listed below in detail under Medication List.  Medical problems were identified and treated as needed.  Home medications were restarted as appropriate.  Improvement was monitored by observation and Leonard Martinez daily report of symptom reduction.  Emotional and mental status was monitored by daily self inventory reports completed by Leonard Martinez and clinical staff.  Patient reported continued improvement, denied any new concerns.  Patient had been compliant on medications and denied side effects.  Support and encouragement was provided.  To note, patient had a tick bite, monitored the site during stay,  no further adverse complications redness nor swelling, area confined directly to the bite site.       Leonard Martinez was evaluated by the treatment team for stability and plans for continued recovery upon discharge.  Patient was offered further treatment options upon discharge including Residential, Intensive Outpatient and Outpatient treatment. Patient will follow up  with agency listed below for medication management and counseling.  Encouraged patient to maintain satisfactory support network and home environment.  Advised to adhere to medication compliance and outpatient treatment follow up.  Prescriptions provided.       Leonard Martinez motivation was an integral factor for scheduling further treatment.  Employment, transportation, bed availability, health status, family support, and any pending legal issues were also considered during patient's hospital stay.  Upon completion of this admission the patient was both mentally and medically stable for discharge denying suicidal/homicidal ideation, auditory/visual/tactile hallucinations, delusional thoughts and paranoia.      Physical Findings: AIMS: Facial and Oral Movements Muscles of Facial Expression: None, normal Lips and Perioral Area: None, normal Jaw: None, normal Tongue: None, normal,Extremity Movements Upper (arms, wrists, hands, fingers): None, normal Lower (legs, knees, ankles, toes): None, normal, Trunk Movements Neck, shoulders, hips: None, normal, Overall Severity Severity of abnormal movements (highest score from questions above): None, normal Incapacitation due to abnormal movements: None, normal Patient's awareness of abnormal movements (rate only patient's report): No Awareness, Dental Status Current problems with teeth and/or dentures?: Yes Does patient usually wear dentures?: No  CIWA:  CIWA-Ar Total: 3 COWS:  COWS Total Score: 1  Musculoskeletal: Strength & Muscle Tone: within normal limits Gait & Station: normal Patient leans: N/A  Psychiatric Specialty Exam: Physical Exam  Nursing note and vitals reviewed.   ROS  Blood pressure 94/63, pulse 92, temperature 97.9 F (36.6 C), temperature source Oral, resp. rate 16, height 5\' 8"  (1.727 m), weight 76.7 kg (169 lb).Body mass index is 25.7 kg/m.    Have you used any form of tobacco in the last 30 days? (Cigarettes, Smokeless  Tobacco, Cigars, and/or Pipes): Yes  Has this patient used any form of tobacco in the last 30 days? (Cigarettes, Smokeless Tobacco, Cigars, and/or Pipes) Yes, N/A  Blood Alcohol level:  Lab Results  Component Value Date   ETH <5 06/25/2015    Metabolic Disorder Labs:  No results found for: HGBA1C, MPG No results found for: PROLACTIN Lab Results  Component Value Date   CHOL 286 (H) 03/15/2017   TRIG 108 03/15/2017   HDL 45 03/15/2017   CHOLHDL 6.4 03/15/2017   VLDL 22 03/15/2017   LDLCALC 219 (H) 03/15/2017    See Psychiatric Specialty Exam and Suicide Risk Assessment completed by Attending Physician prior to discharge.  Discharge destination:  Home  Is patient on multiple antipsychotic therapies at discharge:  No   Has Patient had three or more failed trials of antipsychotic monotherapy by history:  No  Recommended Plan for Multiple Antipsychotic Therapies: NA   Allergies as of 03/24/2017   No Known Allergies     Medication List    STOP taking these medications   acetaminophen 500 MG tablet Commonly known as:  TYLENOL   methocarbamol 500 MG tablet Commonly known as:  ROBAXIN   penicillin v potassium 500 MG tablet Commonly known as:  VEETID     TAKE these medications     Indication  gabapentin 400 MG capsule Commonly known as:  NEURONTIN Take 1 capsule (400 mg total) by mouth 3 (three) times daily.  Indication:  Agitation, Alcohol Withdrawal Syndrome   QUEtiapine 200 MG tablet Commonly known as:  SEROQUEL Take 1 tablet (200 mg total) by mouth at bedtime.  Indication:  Mood control   QUEtiapine 25 MG tablet Commonly known as:  SEROQUEL Take 1 tablet (25 mg total) by mouth 2 (two) times daily.  Indication:  Agitation   sertraline 50 MG tablet Commonly known as:  ZOLOFT Take 1 tablet (50 mg total) by mouth daily.  Indication:  Major Depressive Disorder   thiamine 100 MG tablet Take 1 tablet (100 mg total) by mouth daily.  Indication:  Deficiency in  Thiamine or Vitamin B1   traZODone 50 MG tablet Commonly known as:  DESYREL Take 1 tablet (50 mg total) by mouth at bedtime as needed for sleep.  Indication:  Trouble Sleeping      Follow-up Information    [facility]. Go on 03/24/2017.   Why:  You have been accepted for residential treatment. Please be ready for transport by 11AM. Please be sure that you also have a 21 day supply of medications. Thank you.  Contact information: [address] [place], [place]: [phone] F: [phone]       [facility] Follow up.   Specialty:  Behavioral Health Why:  Please go for a walk-in appointment within 1-3 days [facility]. Walk-in hours are Mon-Fri 8 AM- 3 PM. Please arrive as early as possible to be sure that you are seen. Contact informationElpidio Eric: [address] [place] [address] [phone]           Follow-up recommendations:  Activity:  as tol Diet:  as tol  Comments:  1.  Take all your medications as prescribed.   2.  Report any adverse side effects to outpatient provider. 3.  Patient instructed to not use alcohol or illegal drugs while on prescription medicines. 4.  In the event of worsening symptoms, instructed patient to call 911, the crisis hotline or go to nearest emergency room for evaluation of symptoms.  Signed: Lindwood QuaSheila May Stella Encarnacion, NP [facility] 03/25/2017, 9:40 AM

## 2017-09-15 IMAGING — DX DG CHEST 2V
2 series · 2 of 2 positions shown · non-contrast
Comparison: None.

CLINICAL DATA: Fell 5 days ago. Now with 1 day history of upper
left chest pain and shoulder pain, persisting along with some
paresthesias.

EXAM:
CHEST  2 VIEW

[chest pa]
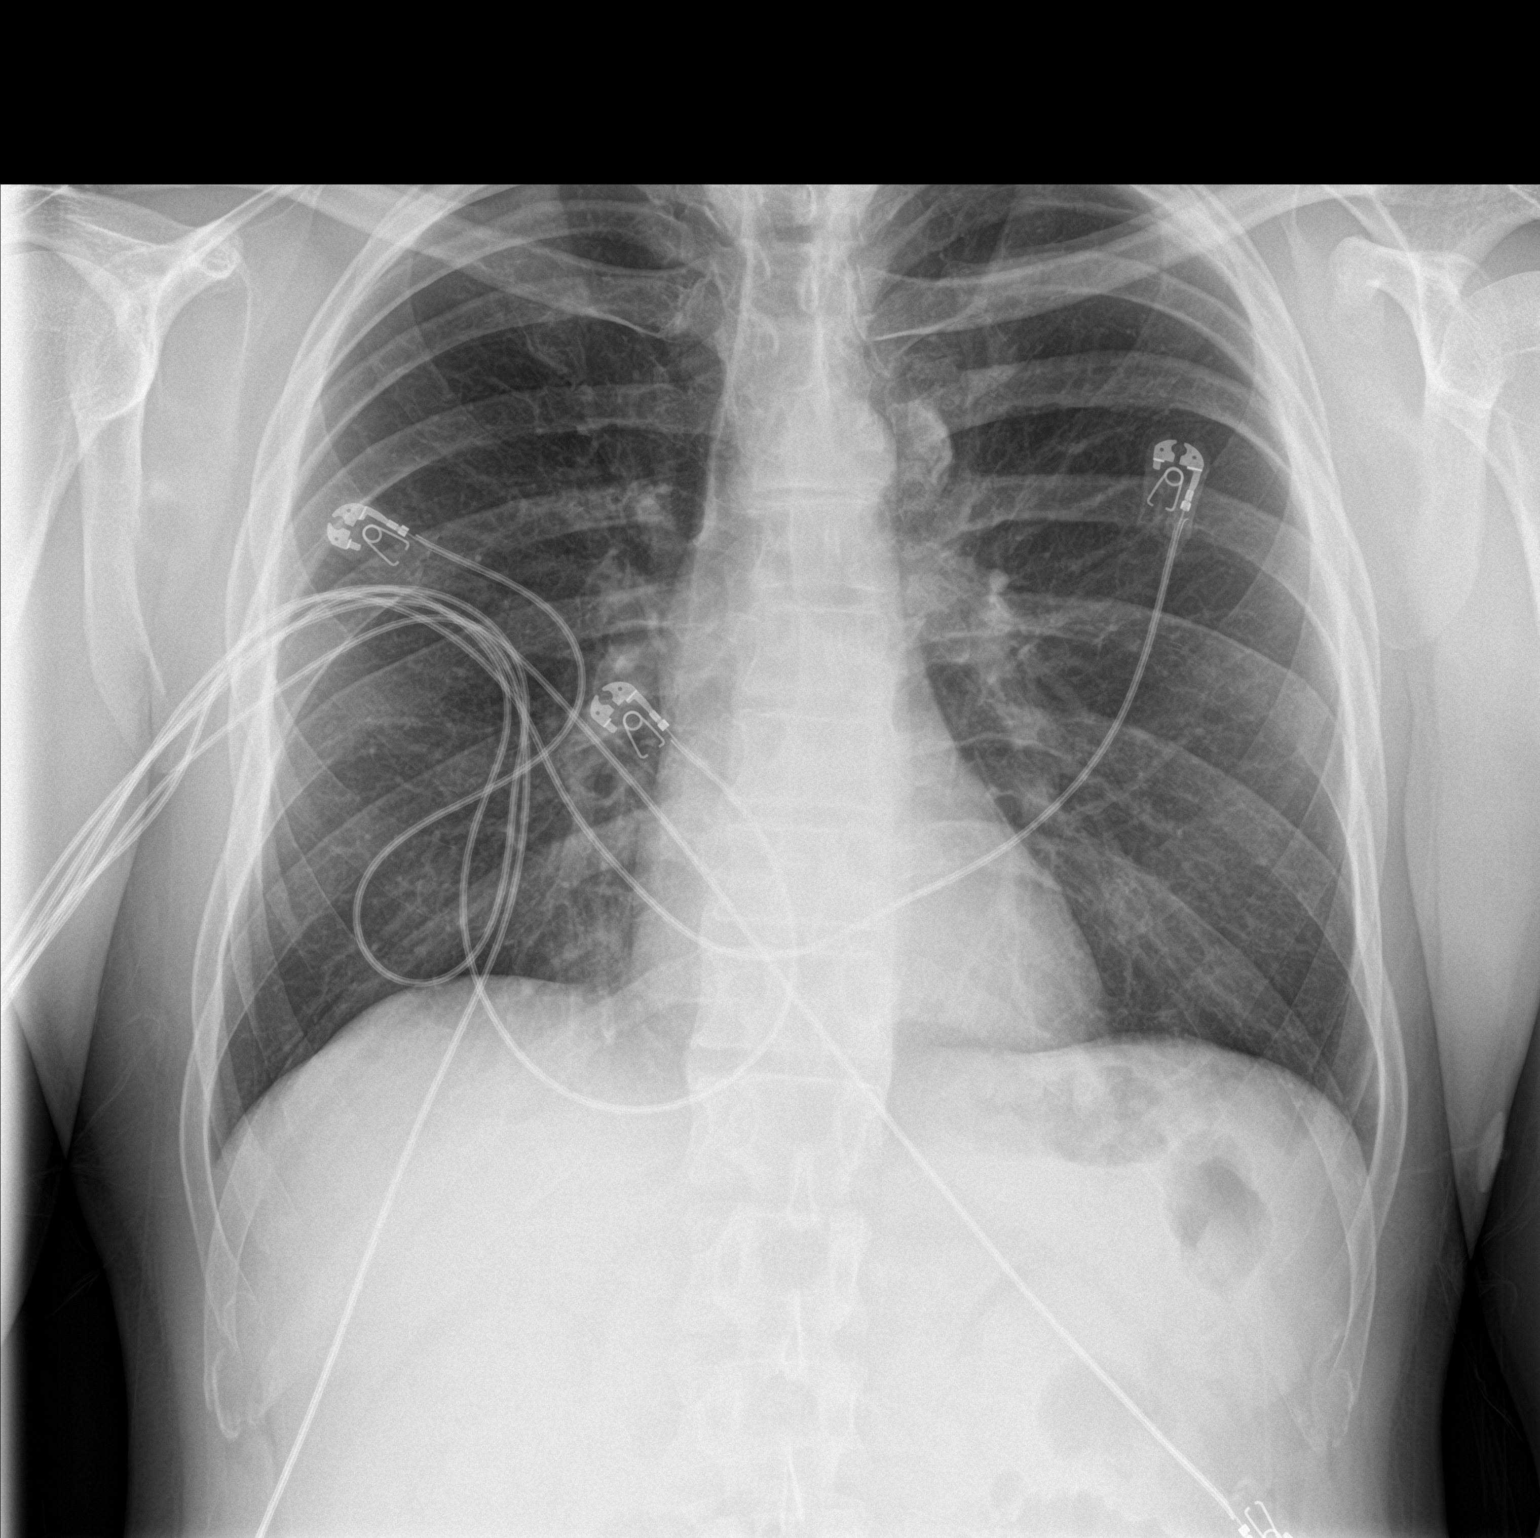

[chest lat]
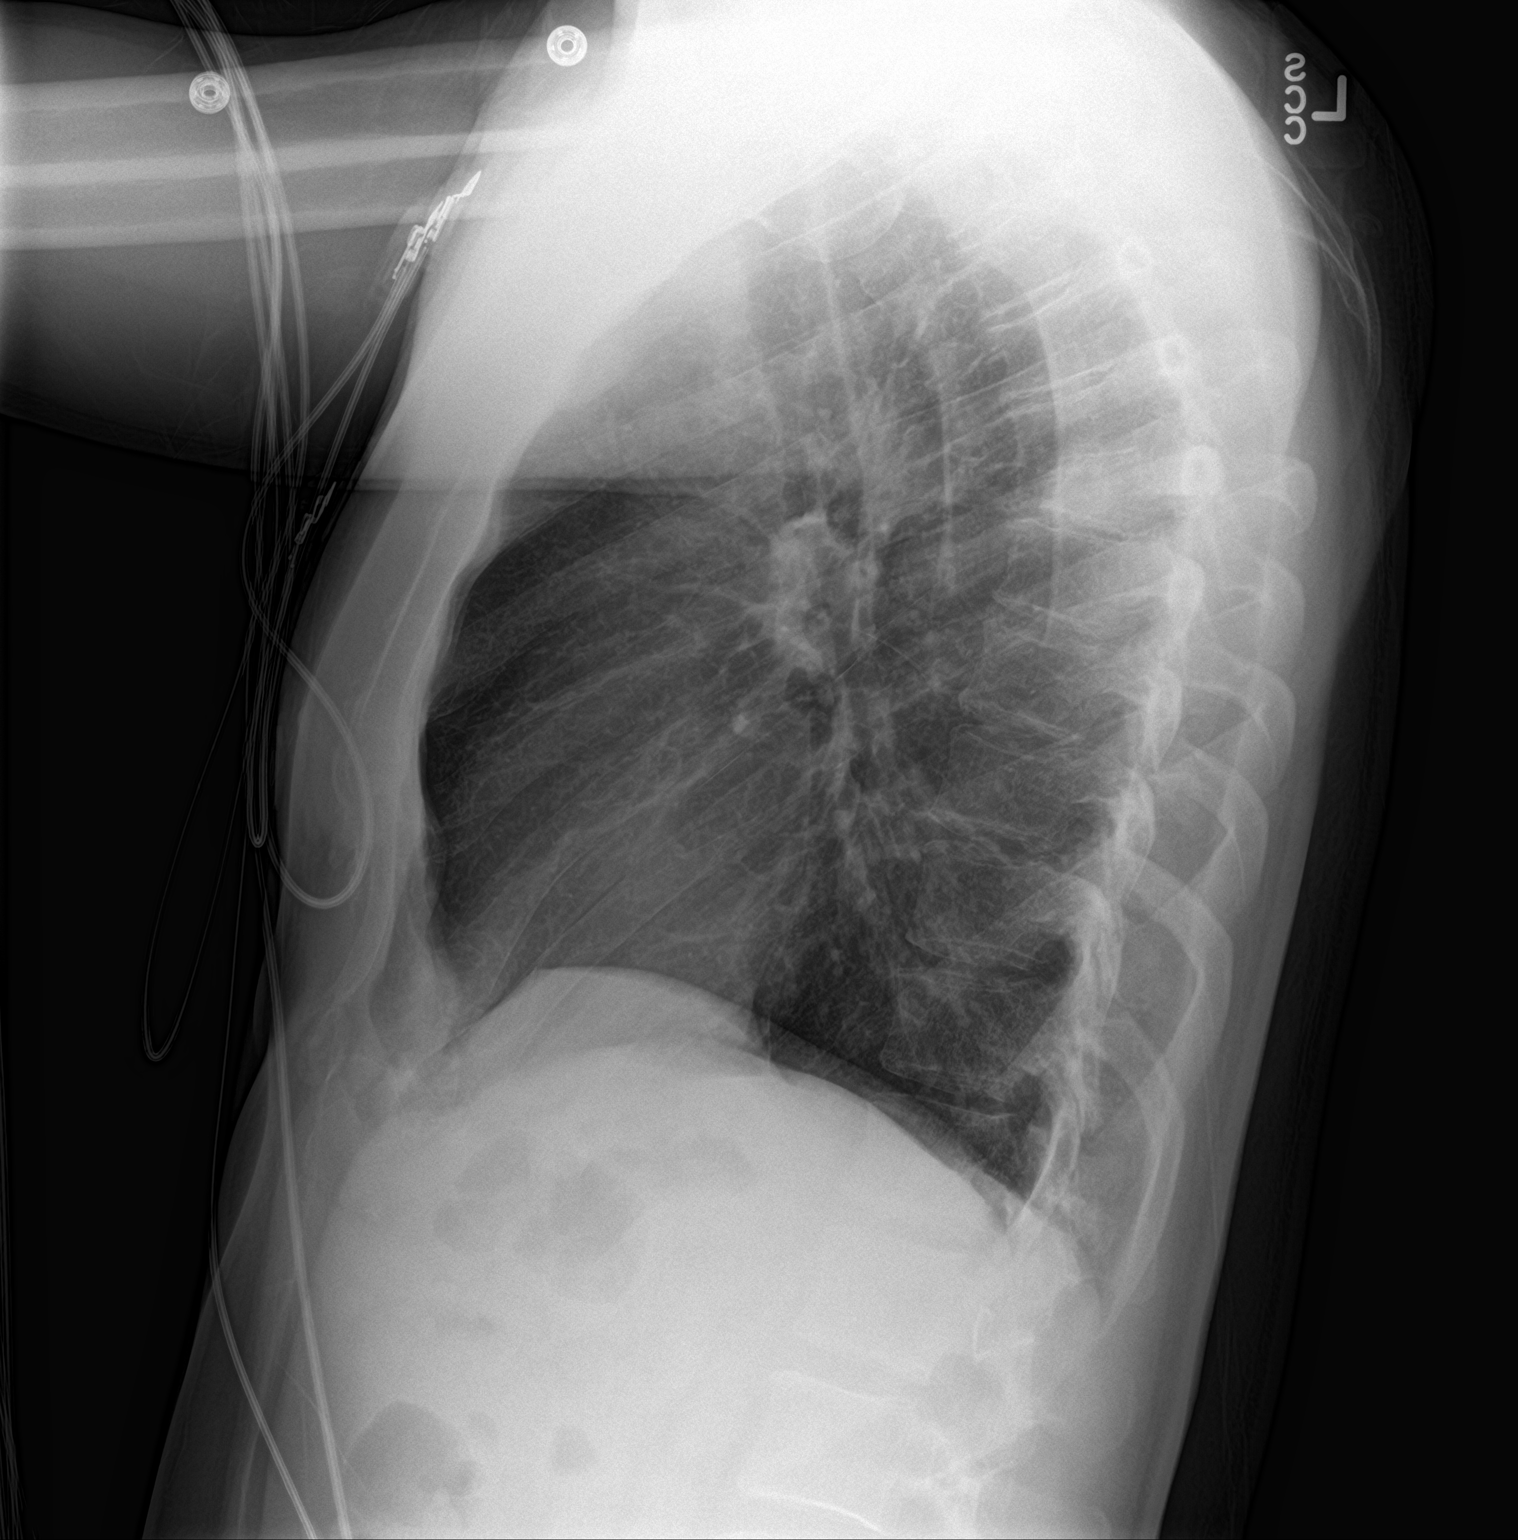

[2 of 2 positions shown; findings below may reference images not displayed]

FINDINGS: The heart size and mediastinal contours are within normal limits.
Both lungs are clear. The visualized skeletal structures are
unremarkable.
IMPRESSION: No active cardiopulmonary disease.

## 2017-09-15 IMAGING — CT CT HEAD W/O CM
5 of 8 series · 17 of 47 positions shown, 18 images · non-contrast
Comparison: None.

CLINICAL DATA: Fell down the stairs 4 days ago with increasing
headache

EXAM:
CT HEAD WITHOUT CONTRAST
CT CERVICAL SPINE WITHOUT CONTRAST
TECHNIQUE: Multidetector CT imaging of the head and cervical spine was
performed following the standard protocol without intravenous
contrast. Multiplanar CT image reconstructions of the cervical spine
were also generated.

[Series 4: head without · axial · non-contrast · 0.46mm/px · z∈[+1429,+1484]mm · 2 of 33 slices shown, 3 images]
[im 11/33  brain]
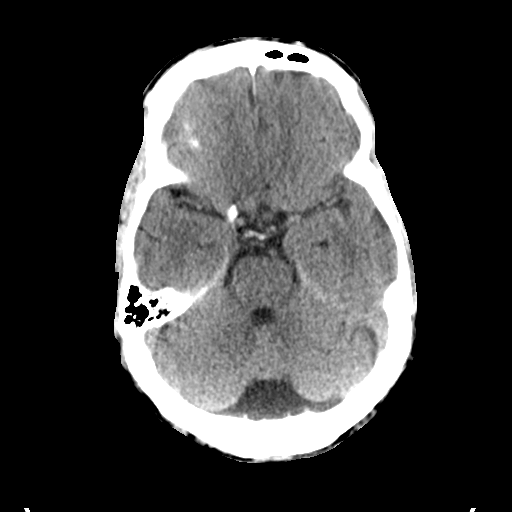
[im 11/33  bone]
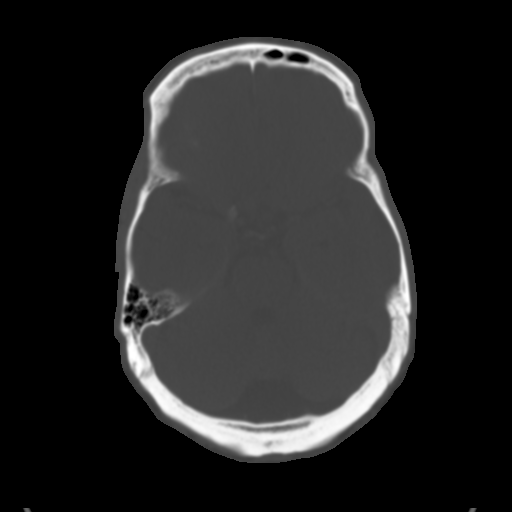
[im 22/33  brain]
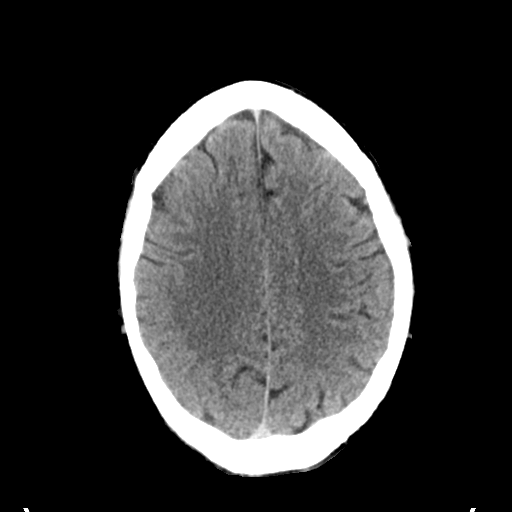

[Series 5: head bone · axial · 0.46mm/px · z∈[+1401,+1519]mm · 6 of 83 slices shown]
[im 12/83  bone]
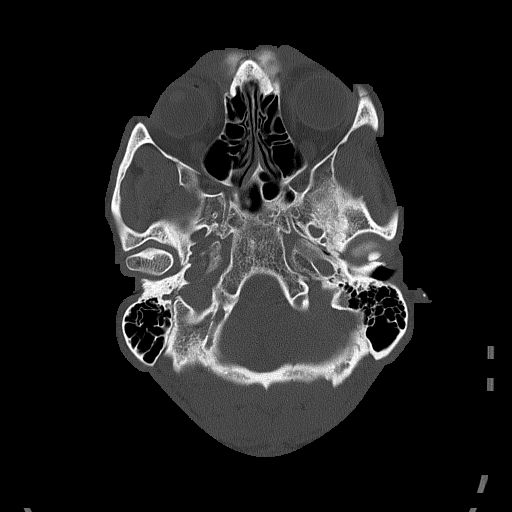
[im 24/83  bone]
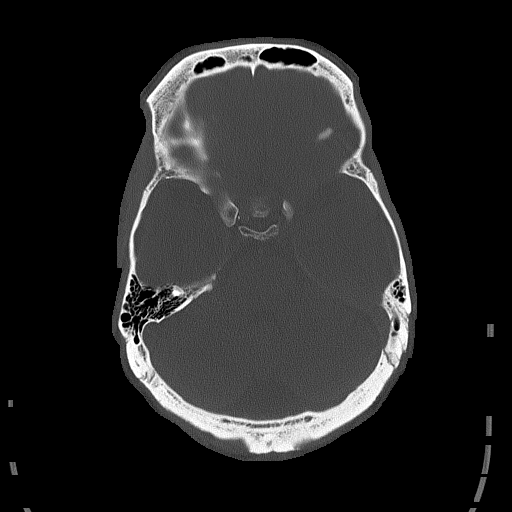
[im 36/83  bone]
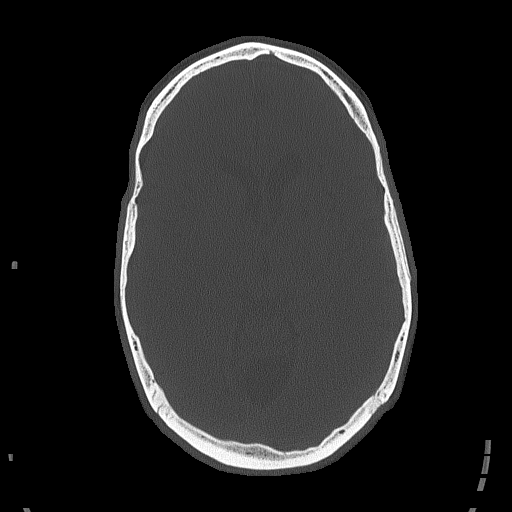
[im 47/83  bone]
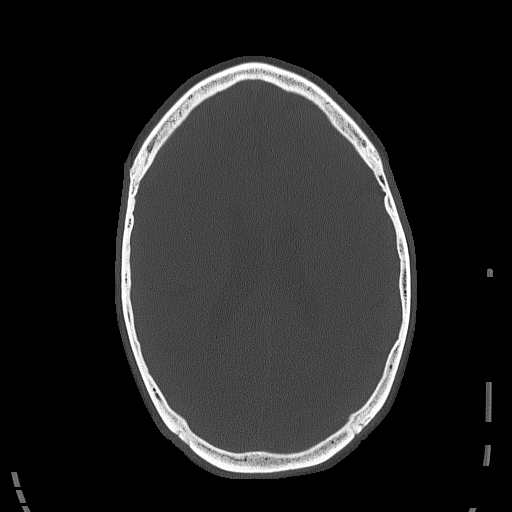
[im 59/83  bone]
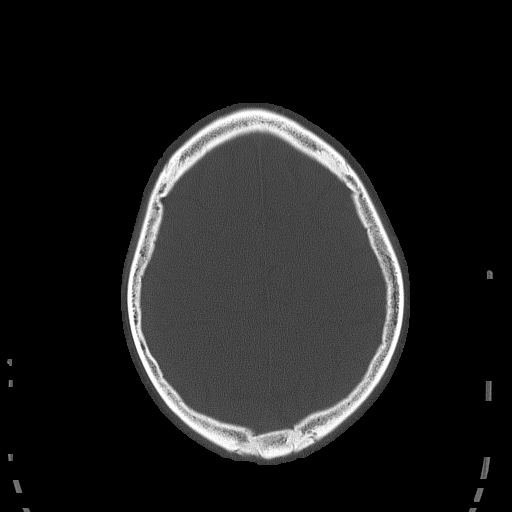
[im 71/83  bone]
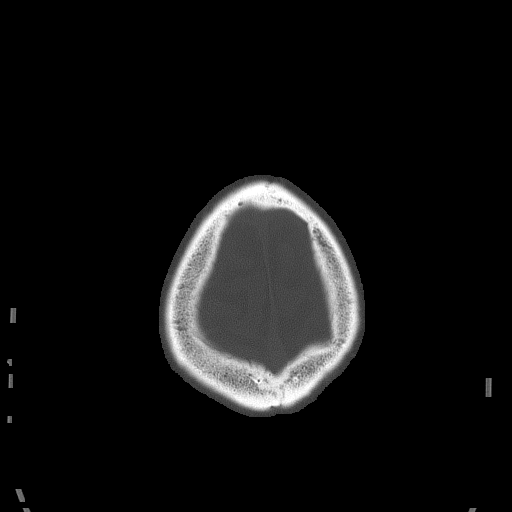

[Series 6: head without cor · coronal · non-contrast · 0.31mm/px · 3 of 78 slices shown]
[im 16/78  brain]
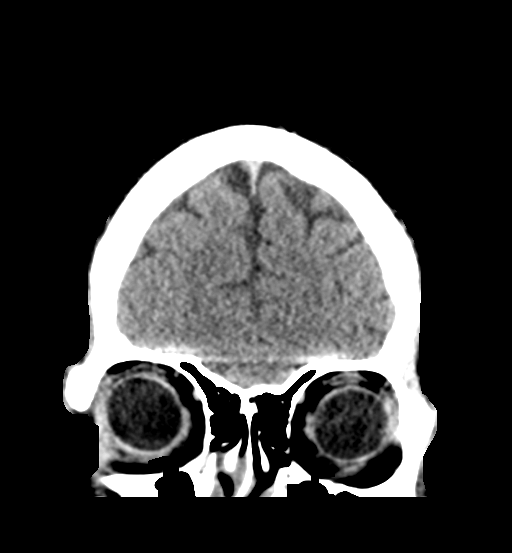
[im 31/78  brain]
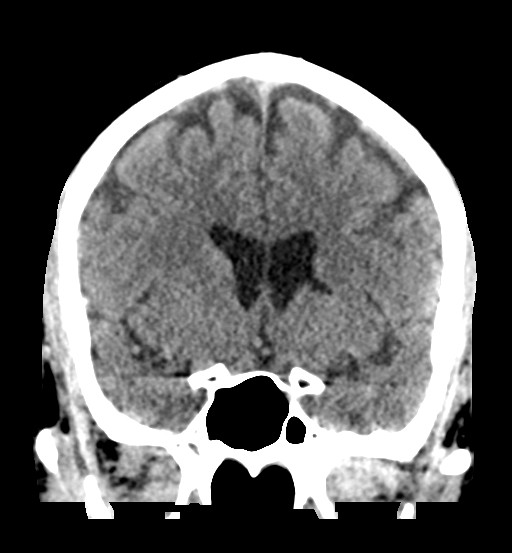
[im 47/78  brain]
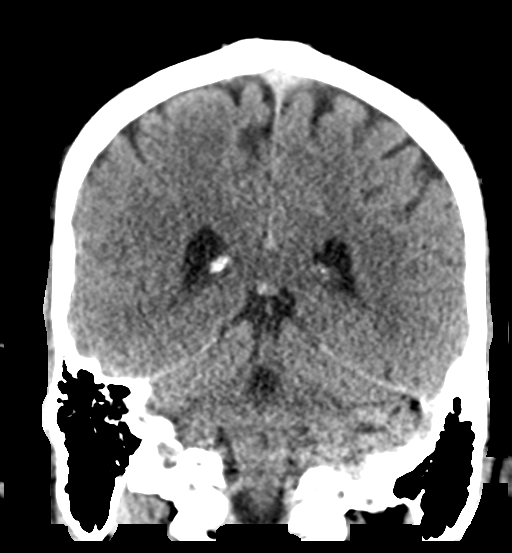

[Series 7: head without sag · sagittal · non-contrast · 0.35mm/px · 2 of 67 slices shown]
[im 23/67  brain]
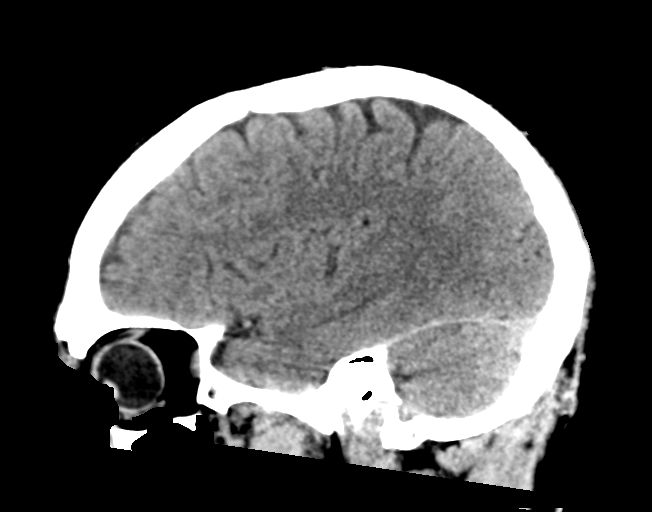
[im 45/67  brain]
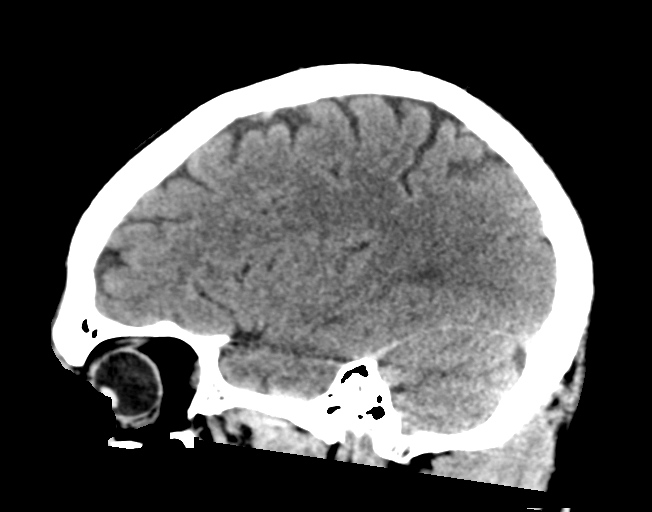

[Series 9: c_spine 2.0 st · axial · 0.30mm/px · z∈[+1241,+1307]mm · 4 of 90 slices shown]
[im 12/90  brain]
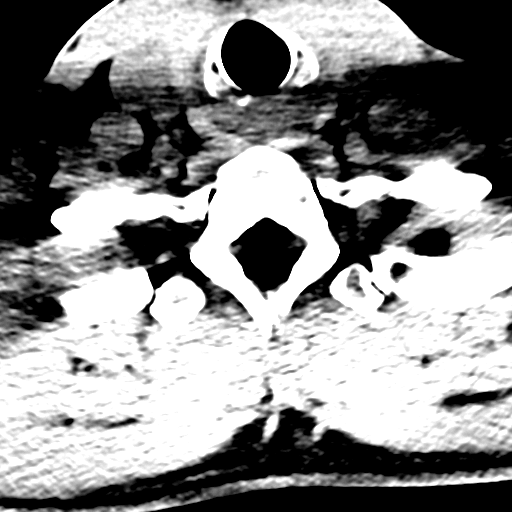
[im 23/90  brain]
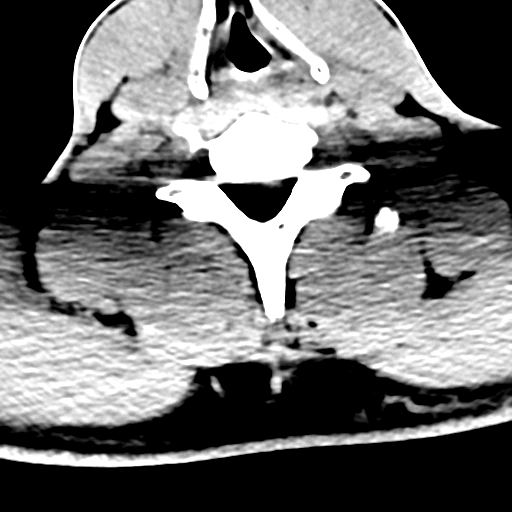
[im 34/90  brain]
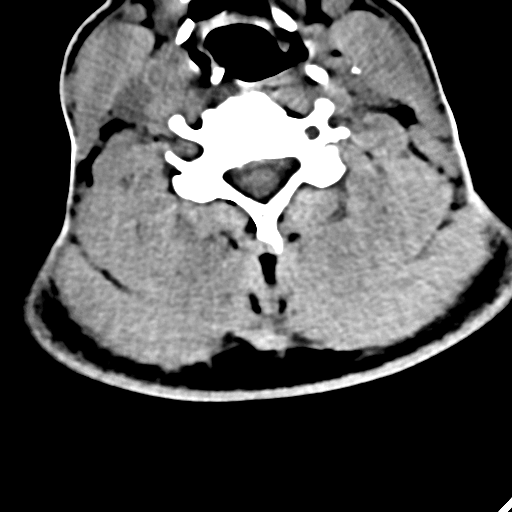
[im 45/90  brain]
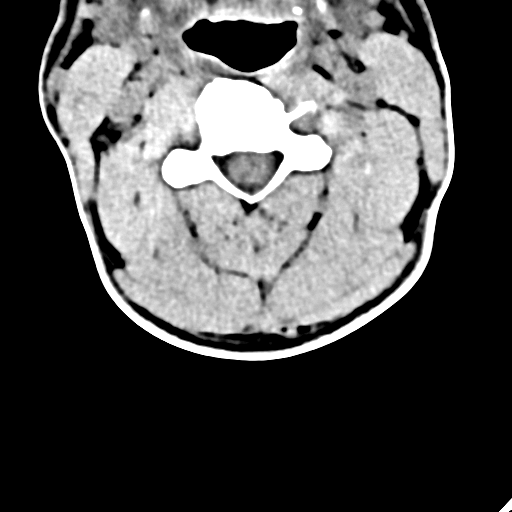

[17 of 47 positions shown; findings below may reference images not displayed]

FINDINGS: CT HEAD FINDINGS

Brain: No acute territorial infarction, hemorrhage or intracranial
mass is visualized. Retro cerebellar CSF density may relate to retro
cerebellar arachnoid cyst or enlarged cisterna magna. Ventricle size
within normal limits.

Vascular: No hyperdense vessels. Scattered calcifications at the
carotid siphon

Skull: No skull fracture.  No suspicious bone lesion.

Sinuses/Orbits: Minimal mucosal thickening in the maxillary and
ethmoid sinuses. No acute orbital abnormality.

Other: None

CT CERVICAL SPINE FINDINGS

Alignment: Straightening of the cervical spine. No subluxation.
Facet alignment is within normal limits.

Skull base and vertebrae: Craniovertebral junction appears. No
fracture is visualized. Vertebral body heights are within normal
limits.

Soft tissues and spinal canal: No prevertebral fluid or swelling. No
visible canal hematoma.

Disc levels: Moderate narrowing at C4-C5 with mild to moderate
narrowing at C5-C6. Posterior disc osteophyte complex at C4-C5 and
C5-C6. Multilevel bilateral foraminal stenosis.

Upper chest: Lung apices are clear. Thyroid gland is within normal
limits.

Other: None
IMPRESSION: 1. No definite CT evidence for acute intracranial abnormality.
2. Straightening of the cervical spine. No definite acute fracture
or malalignment.
